# Patient Record
Sex: Male | Born: 1950 | Race: White | Hispanic: No | Smoking: Former smoker
Health system: Southern US, Community
[De-identification: ages and names within clinical notes are randomized; demographics above are authoritative.]

## PROBLEM LIST (undated history)

## (undated) DIAGNOSIS — Z8619 Personal history of other infectious and parasitic diseases: Secondary | ICD-10-CM

## (undated) DIAGNOSIS — C61 Malignant neoplasm of prostate: Secondary | ICD-10-CM

## (undated) DIAGNOSIS — R972 Elevated prostate specific antigen [PSA]: Secondary | ICD-10-CM

## (undated) DIAGNOSIS — N402 Nodular prostate without lower urinary tract symptoms: Secondary | ICD-10-CM

## (undated) DIAGNOSIS — I2510 Atherosclerotic heart disease of native coronary artery without angina pectoris: Secondary | ICD-10-CM

## (undated) DIAGNOSIS — T63441A Toxic effect of venom of bees, accidental (unintentional), initial encounter: Secondary | ICD-10-CM

## (undated) HISTORY — DX: Nodular prostate without lower urinary tract symptoms: N40.2

## (undated) HISTORY — DX: Elevated prostate specific antigen (PSA): R97.20

## (undated) HISTORY — PX: ELBOW SURGERY: SHX618

## (undated) HISTORY — PX: SHOULDER SURGERY: SHX246

## (undated) HISTORY — PX: TONSILLECTOMY AND ADENOIDECTOMY: SUR1326

## (undated) HISTORY — DX: Personal history of other infectious and parasitic diseases: Z86.19

## (undated) HISTORY — DX: Toxic effect of venom of bees, accidental (unintentional), initial encounter: T63.441A

---

## 2002-09-29 ENCOUNTER — Encounter: Payer: Self-pay | Admitting: Emergency Medicine

## 2002-09-29 ENCOUNTER — Emergency Department (HOSPITAL_COMMUNITY): Admission: EM | Admit: 2002-09-29 | Discharge: 2002-09-29 | Payer: Self-pay | Admitting: Emergency Medicine

## 2003-07-31 ENCOUNTER — Emergency Department (HOSPITAL_COMMUNITY): Admission: AD | Admit: 2003-07-31 | Discharge: 2003-07-31 | Payer: Self-pay | Admitting: Family Medicine

## 2003-08-02 ENCOUNTER — Encounter: Admission: RE | Admit: 2003-08-02 | Discharge: 2003-08-02 | Payer: Self-pay | Admitting: Internal Medicine

## 2003-08-03 ENCOUNTER — Encounter: Admission: RE | Admit: 2003-08-03 | Discharge: 2003-08-03 | Payer: Self-pay | Admitting: Internal Medicine

## 2010-10-22 ENCOUNTER — Emergency Department (HOSPITAL_COMMUNITY)
Admission: EM | Admit: 2010-10-22 | Discharge: 2010-10-22 | Payer: Self-pay | Source: Home / Self Care | Admitting: Emergency Medicine

## 2011-03-25 ENCOUNTER — Other Ambulatory Visit: Payer: Self-pay | Admitting: Internal Medicine

## 2011-03-25 NOTE — Telephone Encounter (Signed)
Patient has appt for CPX on 06/24/2011 at 1:00

## 2011-03-25 NOTE — Telephone Encounter (Signed)
Dr.Hopper is no longer accepting New Patient's. Please have patient schedule New Patient/Establish appointment with another Dr. In our facility.  Unable to locate patient in Centricity or IDX. Patient may have seen Dr.Hopper in the Hospital, no documentation of patient being an Established patient with Dr.Hopper

## 2011-04-02 NOTE — Telephone Encounter (Signed)
Has resch to see Dr Drue Novel on Wed 05/01/2011 as New patient Physical---Informed patient that, since Dr Drue Novel has not seen this patient, he would not be able to order a new prescription for the Epi-pen---pt understood

## 2011-04-05 ENCOUNTER — Other Ambulatory Visit: Payer: Self-pay | Admitting: Internal Medicine

## 2011-04-08 NOTE — Telephone Encounter (Signed)
Dr.Paz please advise if ok to fill EPI-Pen and then forward to [facility]. **Patient seen Dr.Hopper 2004 (8 years ago), pending New Patient appointment with you on 05/01/2011

## 2011-04-08 NOTE — Telephone Encounter (Signed)
[facility]opp's patient

## 2011-04-08 NOTE — Telephone Encounter (Signed)
Pt is establishing with you in August. Has not seen Hopp since 2004.

## 2011-04-10 ENCOUNTER — Telehealth: Payer: Self-pay | Admitting: *Deleted

## 2011-04-10 ENCOUNTER — Other Ambulatory Visit: Payer: Self-pay | Admitting: Internal Medicine

## 2011-04-10 NOTE — Telephone Encounter (Signed)
Error

## 2011-04-10 NOTE — Telephone Encounter (Signed)
Duplicate, patient has pending appointment with Dr.Paz and a phone note is already pending on Dr.Paz's Desktop to be addressed

## 2011-04-10 NOTE — Telephone Encounter (Signed)
I have no information about this patient , don't know if he was ever prescribed EpiPen by Dr. Alwyn Ren, I don't know if he has heart disease or high blood pressure. Get a sooner appointment if so desire

## 2011-04-10 NOTE — Telephone Encounter (Signed)
Message left for patient to return my call.  

## 2011-04-12 NOTE — Telephone Encounter (Signed)
Pt is aware.  

## 2011-05-01 ENCOUNTER — Encounter: Payer: Self-pay | Admitting: Internal Medicine

## 2011-05-01 ENCOUNTER — Ambulatory Visit (INDEPENDENT_AMBULATORY_CARE_PROVIDER_SITE_OTHER): Payer: BC Managed Care – PPO | Admitting: Internal Medicine

## 2011-05-01 DIAGNOSIS — T6391XA Toxic effect of contact with unspecified venomous animal, accidental (unintentional), initial encounter: Secondary | ICD-10-CM

## 2011-05-01 DIAGNOSIS — T63441A Toxic effect of venom of bees, accidental (unintentional), initial encounter: Secondary | ICD-10-CM

## 2011-05-01 DIAGNOSIS — H9190 Unspecified hearing loss, unspecified ear: Secondary | ICD-10-CM | POA: Insufficient documentation

## 2011-05-01 MED ORDER — EPINEPHRINE 0.3 MG/0.3ML IJ DEVI: 0.3000 mg | Freq: Once | INTRAMUSCULAR | Status: DC

## 2011-05-01 NOTE — Progress Notes (Signed)
  Subjective:    Patient ID: Noah Cochran, male    DOB: Aug 04, 1951, 60 y.o.   MRN: [id number]  HPI New patient, has not been seen in years, used to see Dr. Alfonse Flavors. Needs a refill on his EpiPen. 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 The reactions are not consistent with every stung, he has been stung 7 times lately without major reaction  but he likes to be ready . Additionally, has decreased right ear hearing for a few weeks.  Past Medical History  Diagnosis Date  . History of chicken pox   . Allergic reaction to bee sting    Past Surgical History  Procedure Date  . Tonsillectomy and adenoidectomy   . Elbow surgery     R elbow  . Shoulder surgery     reconstruction   History   Social History  . Marital Status: Divorced    Spouse Name: N/A    Number of Children: 1  . Years of Education: N/A   Occupational History  .     Social History Main Topics  . Smoking status: Current Everyday Smoker -- 1.0 packs/day  . Smokeless tobacco: Not on file  . Alcohol Use: Yes     beer after work, 2 /day  . Drug Use: No  . Sexually Active: Not on file   Other Topics Concern  . Not on file   Social History Narrative   Son lives with him   Family History  Problem Relation Age of Onset  . Heart disease      F had a MI age 44  . Diabetes      GF  . Colon cancer Neg Hx   . Prostate cancer Neg Hx      Review of Systems Denies any ear pain, ear discharge. No tinnitus No URI type of symptoms recently. Reports a history of several problems with the ears years ago mostly from diving.    Objective:   Physical Exam  Constitutional: He appears well-developed and well-nourished. No distress.  HENT:  Head: Normocephalic and atraumatic.       Face symmetric, not tender to palpation. Right tympanic membrane slightly retracted but otherwise the right ear is normal. Left ear normal.  Eyes: Conjunctivae and EOM are normal.  Cardiovascular: Normal  rate, regular rhythm and normal heart sounds.   No murmur heard. Pulmonary/Chest: Effort normal and breath sounds normal. No respiratory distress. He has no wheezes. He has no rales.  Skin: He is not diaphoretic.          Assessment & Plan:

## 2011-05-01 NOTE — Assessment & Plan Note (Signed)
Refer to ENT for further evaluation, no acute causes of decreased hearing that I can see

## 2011-05-01 NOTE — Patient Instructions (Signed)
Will refer you to a ENT specialist Came back for a complete physical (fasting) at your convenience

## 2011-05-01 NOTE — Assessment & Plan Note (Signed)
History of severe reactions on and off to bee stings. Refill EpiPen. Reminded the patient that the most important action is prevention. ER if he has a severe reaction

## 2011-05-07 ENCOUNTER — Encounter: Payer: Self-pay | Admitting: Internal Medicine

## 2011-05-16 ENCOUNTER — Other Ambulatory Visit: Payer: Self-pay | Admitting: *Deleted

## 2011-05-16 MED ORDER — EPINEPHRINE 0.3 MG/0.3ML IJ DEVI: 0.3000 mg | Freq: Once | INTRAMUSCULAR | Status: DC

## 2011-06-24 ENCOUNTER — Encounter: Payer: Self-pay | Admitting: Internal Medicine

## 2012-12-16 ENCOUNTER — Emergency Department (HOSPITAL_COMMUNITY)
Admission: EM | Admit: 2012-12-16 | Discharge: 2012-12-16 | Disposition: A | Discharge status: Home / Self Care | Payer: Worker's Compensation | Attending: Emergency Medicine | Admitting: Emergency Medicine

## 2012-12-16 ENCOUNTER — Encounter (HOSPITAL_COMMUNITY): Payer: Self-pay | Admitting: Emergency Medicine

## 2012-12-16 ENCOUNTER — Emergency Department (HOSPITAL_COMMUNITY): Payer: Worker's Compensation

## 2012-12-16 DIAGNOSIS — S61209A Unspecified open wound of unspecified finger without damage to nail, initial encounter: Secondary | ICD-10-CM | POA: Insufficient documentation

## 2012-12-16 DIAGNOSIS — S62639B Displaced fracture of distal phalanx of unspecified finger, initial encounter for open fracture: Secondary | ICD-10-CM | POA: Insufficient documentation

## 2012-12-16 DIAGNOSIS — S62609B Fracture of unspecified phalanx of unspecified finger, initial encounter for open fracture: Secondary | ICD-10-CM

## 2012-12-16 DIAGNOSIS — Y9389 Activity, other specified: Secondary | ICD-10-CM | POA: Insufficient documentation

## 2012-12-16 DIAGNOSIS — Z23 Encounter for immunization: Secondary | ICD-10-CM | POA: Insufficient documentation

## 2012-12-16 DIAGNOSIS — Y990 Civilian activity done for income or pay: Secondary | ICD-10-CM | POA: Insufficient documentation

## 2012-12-16 DIAGNOSIS — F17200 Nicotine dependence, unspecified, uncomplicated: Secondary | ICD-10-CM | POA: Insufficient documentation

## 2012-12-16 DIAGNOSIS — Y9289 Other specified places as the place of occurrence of the external cause: Secondary | ICD-10-CM | POA: Insufficient documentation

## 2012-12-16 DIAGNOSIS — IMO0002 Reserved for concepts with insufficient information to code with codable children: Secondary | ICD-10-CM

## 2012-12-16 DIAGNOSIS — W298XXA Contact with other powered powered hand tools and household machinery, initial encounter: Secondary | ICD-10-CM | POA: Insufficient documentation

## 2012-12-16 DIAGNOSIS — Z79899 Other long term (current) drug therapy: Secondary | ICD-10-CM | POA: Insufficient documentation

## 2012-12-16 DIAGNOSIS — Z8619 Personal history of other infectious and parasitic diseases: Secondary | ICD-10-CM | POA: Insufficient documentation

## 2012-12-16 LAB — POCT I-STAT, CHEM 8
BUN: 17 mg/dL (ref 6–23)
Calcium, Ion: 1.17 mmol/L (ref 1.13–1.30)
Chloride: 107 mEq/L (ref 96–112)
Creatinine, Ser: 0.8 mg/dL (ref 0.50–1.35)
Glucose, Bld: 93 mg/dL (ref 70–99)
HCT: 45 % (ref 39.0–52.0)
Hemoglobin: 15.3 g/dL (ref 13.0–17.0)
Potassium: 4.1 mEq/L (ref 3.5–5.1)
Sodium: 137 mEq/L (ref 135–145)
TCO2: 24 mmol/L (ref 0–100)

## 2012-12-16 MED ORDER — CEPHALEXIN 500 MG PO CAPS: 500.0000 mg | ORAL_CAPSULE | Freq: Four times a day (QID) | ORAL | Status: DC

## 2012-12-16 MED ORDER — ONDANSETRON 4 MG PO TBDP
4.0000 mg | ORAL_TABLET | Freq: Once | ORAL | Status: AC
Start: 2012-12-16 — End: 2012-12-16
  Administered 2012-12-16: 4 mg via ORAL
  Filled 2012-12-16: qty 1

## 2012-12-16 MED ORDER — TETANUS-DIPHTH-ACELL PERTUSSIS 5-2.5-18.5 LF-MCG/0.5 IM SUSP
0.5000 mL | Freq: Once | INTRAMUSCULAR | Status: AC
  Administered 2012-12-16: 0.5 mL via INTRAMUSCULAR
  Filled 2012-12-16: qty 0.5

## 2012-12-16 MED ORDER — OXYCODONE-ACETAMINOPHEN 5-325 MG PO TABS: 2.0000 | ORAL_TABLET | ORAL | Status: DC | PRN

## 2012-12-16 MED ORDER — HYDROCODONE-ACETAMINOPHEN 5-325 MG PO TABS
2.0000 | ORAL_TABLET | Freq: Once | ORAL | Status: AC
  Administered 2012-12-16: 2 via ORAL
  Filled 2012-12-16: qty 2

## 2012-12-16 NOTE — ED Notes (Signed)
Pt with deep wound to left 1st digit from table saw. Bleeding controlled.

## 2012-12-16 NOTE — Progress Notes (Signed)
Orthopedic Tech Progress Note Patient Details:  BUEFORD ARP 1951-09-03 [id number]  Ortho Devices Type of Ortho Device: Finger splint Ortho Device/Splint Interventions: Application   Cammer, Mickie Bail 12/16/2012, 3:39 PM

## 2012-12-16 NOTE — ED Provider Notes (Signed)
Medical screening examination/treatment/procedure(s) were conducted as a shared visit with non-physician practitioner(s) and myself.  I personally evaluated the patient during the encounter Pt suffered a saw cut to the distal phalanx of the left thumb while using a table saw at work.  Exam shows a fish-mouth laceration of the distal phalanx of the left thumb, cutting through just below the nail bed.  Advised this would need repair by the hand surgeon.  Carleene Cooper III, MD 12/16/12 2139

## 2012-12-16 NOTE — ED Provider Notes (Signed)
History     CSN: [id number]  Arrival date & time 12/16/12  1211   First MD Initiated Contact with Patient 12/16/12 1310      Chief Complaint  Patient presents with  . Laceration    (Consider location/radiation/quality/duration/timing/severity/associated sxs/prior treatment) HPI  Patient presents to the ED with complaints of injury to his left thumb. He was using a table saw for work when it accidentally backed up on his finger causing a significant laceration. The bleeding is controlled at this time. He is able to bend his thumb. He has decreased sensation to his finger. Denies any other injuries. Otherwise healthy at baseline. Last tetanus shot was greater than 10 years ago. nad vss  Past Medical History  Diagnosis Date  . History of chicken pox   . Allergic reaction to bee sting     Past Surgical History  Procedure Laterality Date  . Tonsillectomy and adenoidectomy    . Elbow surgery      R elbow  . Shoulder surgery      reconstruction    Family History  Problem Relation Age of Onset  . Heart disease      F had a MI age 36  . Diabetes      GF  . Colon cancer Neg Hx   . Prostate cancer Neg Hx     History  Substance Use Topics  . Smoking status: Current Every Day Smoker -- 1.00 packs/day  . Smokeless tobacco: Not on file  . Alcohol Use: Yes     Comment: beer after work, 2 /day      Review of Systems  All other systems reviewed and are negative.    Allergies  Nutritional supplements  Home Medications   Current Outpatient Rx  Name  Route  Sig  Dispense  Refill  . EPINEPHrine (EPIPEN) 0.3 mg/0.3 mL DEVI   Intramuscular   Inject 0.3 mg into the muscle once as needed (for allergic reaction).         Marland Kitchen ibuprofen (ADVIL,MOTRIN) 200 MG tablet   Oral   Take 200 mg by mouth every 6 (six) hours as needed for pain, fever or headache.           BP 172/74  Pulse 62  Temp(Src) 97.4 F (36.3 C) (Oral)  Resp 18  SpO2 96%  Physical Exam  Nursing  note and vitals reviewed. Constitutional: He appears well-developed and well-nourished. No distress.  HENT:  Head: Normocephalic and atraumatic.  Eyes: Pupils are equal, round, and reactive to light.  Neck: Normal range of motion. Neck supple.  Cardiovascular: Normal rate and regular rhythm.   Pulmonary/Chest: Effort normal.  Abdominal: Soft.  Musculoskeletal:       Hands: A sagittal laceration to right thumb that extends past the nail bed. The border is very irregular and much skin and tissue is missing. NO obvious bone showing. Decreased sensation, FROM. + tenderness to palpation  Neurological: He is alert.  Skin: Skin is warm and dry.    ED Course  NERVE BLOCK Date/Time: 12/16/2012 3:11 PM Performed by: Dorthula Matas Authorized by: Dorthula Matas Risks and benefits: risks, benefits and alternatives were discussed Consent given by: patient Local anesthetic: lidocaine 1% without epinephrine Anesthetic total: 4 ml Outcome: pain improved   (including critical care time)  Labs Reviewed  POCT I-STAT, CHEM 8   Dg Finger Thumb Left  12/16/2012  *RADIOLOGY REPORT*  Clinical Data: Laceration  LEFT THUMB 2+V  Comparison: None.  Findings:  Three views of the left thumb submitted.  There is comminuted probable open fracture at the tip of distal phalanx.  IMPRESSION: Comminuted probable open fracture at the tip of distal phalanx left thumb.   Original Report Authenticated By: Natasha Mead, M.D.      1. Open finger fracture, initial encounter       MDM  2:03pm- Finger wound is extensive. Consulted Dr. Ignacia Palma my supervising physician for guidance. He recommends I call hand surgeon to have finger repaired. Consulting on-call Hydrographic surveyor.  Tetanus ordered, 2 vicodin PO given and I-stat 8 ordered.    Spoke with Dr. Orlan Leavens who recommends digital block, irrigate, non adherent dressing, splint, keflex abx and have pt call Dr. Tonna Corner office on Thursday or Friday.  .  Pt has  been advised of the symptoms that warrant their return to the ED. Patient has voiced understanding and has agreed to follow-up with the PCP or specialist.     Dorthula Matas, PA-C 12/16/12 1511

## 2015-01-19 ENCOUNTER — Other Ambulatory Visit: Payer: Self-pay

## 2015-03-02 ENCOUNTER — Telehealth: Payer: Self-pay | Admitting: *Deleted

## 2015-03-02 NOTE — Telephone Encounter (Signed)
Unable to reach patient at time of Pre-Visit Call.  Left message for patient to return call when available.    

## 2015-03-03 ENCOUNTER — Ambulatory Visit (INDEPENDENT_AMBULATORY_CARE_PROVIDER_SITE_OTHER): Payer: 59 | Admitting: Internal Medicine

## 2015-03-03 ENCOUNTER — Encounter: Payer: Self-pay | Admitting: Internal Medicine

## 2015-03-03 VITALS — BP 106/66 | HR 85 | Temp 98.1°F | Ht 64.5 in | Wt 112.1 lb

## 2015-03-03 DIAGNOSIS — T63444D Toxic effect of venom of bees, undetermined, subsequent encounter: Secondary | ICD-10-CM

## 2015-03-03 DIAGNOSIS — Z0000 Encounter for general adult medical examination without abnormal findings: Secondary | ICD-10-CM | POA: Diagnosis not present

## 2015-03-03 MED ORDER — EPINEPHRINE 0.3 MG/0.3ML IJ SOAJ: 0.3000 mg | Freq: Once | INTRAMUSCULAR | Status: DC

## 2015-03-03 NOTE — Progress Notes (Signed)
Pre visit review using our clinic review tool, if applicable. No additional management support is needed unless otherwise documented below in the visit note. 

## 2015-03-03 NOTE — Progress Notes (Signed)
Subjective:    Patient ID: Noah Cochran, male    DOB: 1950/12/15, 64 y.o.   MRN: [id number]  DOS:  03/03/2015 Type of visit - description : To reestablish, needs a PCP, we agreed to   proceed with a physical exam Interval history: In general feeling well. He remains active at work. Has lost some weight mostly because he is not eating as much, he now lives by himself and he does not like to cook. Continue smoking   Review of Systems Constitutional: No fever. No chills. No unexplained wt changes. No unusual sweats  HEENT: No dental problems, no ear discharge, no facial swelling, no voice changes. No eye discharge, no eye  redness , no  intolerance to light   Respiratory: No wheezing , no  difficulty breathing. No cough , no mucus production  Cardiovascular: No CP, no leg swelling , no  Palpitations  GI: no nausea, no vomiting, no diarrhea , no  abdominal pain.  No blood in the stools. No dysphagia, no odynophagia    Endocrine: No polyphagia, no polyuria , no polydipsia  GU: No dysuria, gross hematuria, difficulty urinating. No urinary urgency, no frequency.  Musculoskeletal: No joint swellings or unusual aches or pains  Skin: No change in the color of the skin, palor , no  Rash  Allergic, immunologic: No environmental allergies , no  food allergies  Neurological: No dizziness no  syncope. No headaches. No diplopia, no slurred, no slurred speech, no motor deficits, no facial  Numbness  Hematological: No enlarged lymph nodes, no easy bruising , no unusual bleedings  Psychiatry: No suicidal ideas, no hallucinations, no beavior problems, no confusion.  No unusual/severe anxiety, no depression    Past Medical History  Diagnosis Date  . History of chicken pox   . Allergic reaction to bee sting     Past Surgical History  Procedure Laterality Date  . Tonsillectomy and adenoidectomy    . Elbow surgery      R elbow  . Shoulder surgery      reconstruction    History     Social History  . Marital Status: Divorced    Spouse Name: N/A  . Number of Children: 1  . Years of Education: N/A   Occupational History  . carpenter    Social History Main Topics  . Smoking status: Current Every Day Smoker -- 1.00 packs/day  . Smokeless tobacco: Not on file     Comment: 1 ppd   . Alcohol Use: 0.0 oz/week    0 Standard drinks or equivalent per week     Comment: beer after work, 2 /day  . Drug Use: No  . Sexual Activity: Not on file   Other Topics Concern  . Not on file   Social History Narrative   Lives by himself     Family History  Problem Relation Age of Onset  . Heart disease Father     F had a MI age 71  . Diabetes Other     GF  . Colon cancer Neg Hx   . Prostate cancer Neg Hx       Medication List       This list is accurate as of: 03/03/15 11:59 PM.  Always use your most recent med list.               EPINEPHrine 0.3 mg/0.3 mL Soaj injection  Commonly known as:  EPI-PEN  Inject 0.3 mLs (0.3 mg total) into the  muscle once.     ibuprofen 200 MG tablet  Commonly known as:  ADVIL,MOTRIN  Take 200 mg by mouth every 6 (six) hours as needed for pain, fever or headache.     MULTIVITAMIN PO  Take 1 tablet by mouth daily.           Objective:   Physical Exam BP 106/66 mmHg  Pulse 85  Temp(Src) 98.1 F (36.7 C) (Oral)  Ht 5' 4.5" (1.638 m)  Wt 112 lb 2 oz (50.86 kg)  BMI 18.96 kg/m2  SpO2 95% General:   Well developed, well nourished . Thin  but healthy-appearing  Neck:  Full range of motion. Supple. No  thyromegaly , normal carotid pulse HEENT:  Normocephalic . Face symmetric, atraumatic. No lymphadenopathies Lungs:  CTA B Normal respiratory effort, no intercostal retractions, no accessory muscle use. Heart: RRR,  no murmur.  No pretibial edema bilaterally  Abdomen:  Not distended, soft, non-tender. No rebound or rigidity. No mass,organomegaly Skin: Exposed areas without rash. Not pale. Not jaundice. Rectal:   External abnormalities: none. Normal sphincter tone. No rectal masses or tenderness.  No stool   Prostate: Prostate gland firm and smooth, symmetrically enlarged without nodularity, tenderness, mass  Neurologic:  alert & oriented X3.  Speech normal, gait appropriate for age and unassisted Strength symmetric and appropriate for age.  Psych: Cognition and judgment appear intact.  Cooperative with normal attention span and concentration.  Behavior appropriate. No anxious or depressed appearing.       Assessment & Plan:

## 2015-03-03 NOTE — Assessment & Plan Note (Signed)
RF epipen

## 2015-03-03 NOTE — Patient Instructions (Signed)
Please schedule labs to be done within few days (fasting)   Smoking Cessation Quitting smoking is important to your health and has many advantages. However, it is not always easy to quit since nicotine is a very addictive drug. Oftentimes, people try 3 times or more before being able to quit. This document explains the best ways for you to prepare to quit smoking. Quitting takes hard work and a lot of effort, but you can do it. ADVANTAGES OF QUITTING SMOKING  You will live longer, feel better, and live better.  Your body will feel the impact of quitting smoking almost immediately.  Within 20 minutes, blood pressure decreases. Your pulse returns to its normal level.  After 8 hours, carbon monoxide levels in the blood return to normal. Your oxygen level increases.  After 24 hours, the chance of having a heart attack starts to decrease. Your breath, hair, and body stop smelling like smoke.  After 48 hours, damaged nerve endings begin to recover. Your sense of taste and smell improve.  After 72 hours, the body is virtually free of nicotine. Your bronchial tubes relax and breathing becomes easier.  After 2 to 12 weeks, lungs can hold more air. Exercise becomes easier and circulation improves.  The risk of having a heart attack, stroke, cancer, or lung disease is greatly reduced.  After 1 year, the risk of coronary heart disease is cut in half.  After 5 years, the risk of stroke falls to the same as a nonsmoker.  After 10 years, the risk of lung cancer is cut in half and the risk of other cancers decreases significantly.  After 15 years, the risk of coronary heart disease drops, usually to the level of a nonsmoker.  If you are pregnant, quitting smoking will improve your chances of having a healthy baby.  The people you live with, especially any children, will be healthier.  You will have extra money to spend on things other than cigarettes. QUESTIONS TO THINK ABOUT BEFORE ATTEMPTING  TO QUIT You may want to talk about your answers with your health care provider.  Why do you want to quit?  If you tried to quit in the past, what helped and what did not?  What will be the most difficult situations for you after you quit? How will you plan to handle them?  Who can help you through the tough times? Your family? Friends? A health care provider?  What pleasures do you get from smoking? What ways can you still get pleasure if you quit? Here are some questions to ask your health care provider:  How can you help me to be successful at quitting?  What medicine do you think would be best for me and how should I take it?  What should I do if I need more help?  What is smoking withdrawal like? How can I get information on withdrawal? GET READY  Set a quit date.  Change your environment by getting rid of all cigarettes, ashtrays, matches, and lighters in your home, car, or work. Do not let people smoke in your home.  Review your past attempts to quit. Think about what worked and what did not. GET SUPPORT AND ENCOURAGEMENT You have a better chance of being successful if you have help. You can get support in many ways.  Tell your family, friends, and coworkers that you are going to quit and need their support. Ask them not to smoke around you.  Get individual, group, or telephone counseling  and support. Programs are available at [employer] and health centers. Call your local health department for information about programs in your area.  Spiritual beliefs and practices may help some smokers quit.  Download a "quit meter" on your computer to keep track of quit statistics, such as how long you have gone without smoking, cigarettes not smoked, and money saved.  Get a self-help book about quitting smoking and staying off tobacco. [place] yourself from urges to smoke. Talk to someone, go for a walk, or occupy your time with a  task.  Change your normal routine. Take a different route to work. Drink tea instead of coffee. Eat breakfast in a different place.  Reduce your stress. Take a hot bath, exercise, or read a book.  Plan something enjoyable to do every day. Reward yourself for not smoking.  Explore interactive web-based programs that specialize in helping you quit. GET MEDICINE AND USE IT CORRECTLY Medicines can help you stop smoking and decrease the urge to smoke. Combining medicine with the above behavioral methods and support can greatly increase your chances of successfully quitting smoking.  Nicotine replacement therapy helps deliver nicotine to your body without the negative effects and risks of smoking. Nicotine replacement therapy includes nicotine gum, lozenges, inhalers, nasal sprays, and skin patches. Some may be available over-the-counter and others require a prescription.  Antidepressant medicine helps people abstain from smoking, but how this works is unknown. This medicine is available by prescription.  Nicotinic receptor partial agonist medicine simulates the effect of nicotine in your brain. This medicine is available by prescription. Ask your health care provider for advice about which medicines to use and how to use them based on your health history. Your health care provider will tell you what side effects to look out for if you choose to be on a medicine or therapy. Carefully read the information on the package. Do not use any other product containing nicotine while using a nicotine replacement product.  RELAPSE OR DIFFICULT SITUATIONS Most relapses occur within the first 3 months after quitting. Do not be discouraged if you start smoking again. Remember, most people try several times before finally quitting. You may have symptoms of withdrawal because your body is used to nicotine. You may crave cigarettes, be irritable, feel very hungry, cough often, get headaches, or have difficulty  concentrating. The withdrawal symptoms are only temporary. They are strongest when you first quit, but they will go away within 10-14 days. To reduce the chances of relapse, try to:  Avoid drinking alcohol. Drinking lowers your chances of successfully quitting.  Reduce the amount of caffeine you consume. Once you quit smoking, the amount of caffeine in your body increases and can give you symptoms, such as a rapid heartbeat, sweating, and anxiety.  Avoid smokers because they can make you want to smoke.  Do not let weight gain distract you. Many smokers will gain weight when they quit, usually less than 10 pounds. Eat a healthy diet and stay active. You can always lose the weight gained after you quit.  Find ways to improve your mood other than smoking. FOR MORE INFORMATION  www.smokefree.gov  Document Released: 09/03/2001 Document Revised: 01/24/2014 Document Reviewed: 12/19/2011 [facility] Patient Information 2015 [place], [place]. This information is not intended to replace advice given to you by your health care provider. Make sure you discuss any questions you have with your health care provider.

## 2015-03-03 NOTE — Assessment & Plan Note (Signed)
Td 2014  Colon cancer screening: Never had a colonoscopy, Hemoccults versus colonoscopy discussed-- elected cscope , referred  Prostate cancer screening: DRE show a symmetrically enlarged prostate, no symptoms, check a PSA Tobacco abuse: Discussed. He does see the dentist regularly. General labs Diet and exercise discussed

## 2015-03-07 ENCOUNTER — Other Ambulatory Visit (INDEPENDENT_AMBULATORY_CARE_PROVIDER_SITE_OTHER): Payer: 59

## 2015-03-07 DIAGNOSIS — Z0000 Encounter for general adult medical examination without abnormal findings: Secondary | ICD-10-CM

## 2015-03-07 LAB — COMPREHENSIVE METABOLIC PANEL
ALT: 22 U/L (ref 0–53)
AST: 30 U/L (ref 0–37)
Albumin: 4.2 g/dL (ref 3.5–5.2)
Alkaline Phosphatase: 91 U/L (ref 39–117)
BUN: 15 mg/dL (ref 6–23)
CO2: 28 mEq/L (ref 19–32)
Calcium: 9.5 mg/dL (ref 8.4–10.5)
Chloride: 104 mEq/L (ref 96–112)
Creatinine, Ser: 0.86 mg/dL (ref 0.40–1.50)
GFR: 95.58 mL/min (ref >60.00)
Glucose, Bld: 86 mg/dL (ref 70–99)
Potassium: 4.7 mEq/L (ref 3.5–5.1)
Sodium: 139 mEq/L (ref 135–145)
Total Bilirubin: 0.6 mg/dL (ref 0.2–1.2)
Total Protein: 7.4 g/dL (ref 6.0–8.3)

## 2015-03-07 LAB — LIPID PANEL
Cholesterol: 237 mg/dL — ABNORMAL HIGH (ref 0–200)
HDL: 77.7 mg/dL (ref >39.00)
LDL Cholesterol: 142 mg/dL — ABNORMAL HIGH (ref 0–99)
NonHDL: 159.3
Total CHOL/HDL Ratio: 3
Triglycerides: 89 mg/dL (ref 0.0–149.0)
VLDL: 17.8 mg/dL (ref 0.0–40.0)

## 2015-03-07 LAB — CBC WITH DIFFERENTIAL/PLATELET
Basophils Absolute: 0.1 10*3/uL (ref 0.0–0.1)
Basophils Relative: 0.9 % (ref 0.0–3.0)
Eosinophils Absolute: 0.4 10*3/uL (ref 0.0–0.7)
Eosinophils Relative: 7 % — ABNORMAL HIGH (ref 0.0–5.0)
HCT: 46.6 % (ref 39.0–52.0)
Hemoglobin: 15.8 g/dL (ref 13.0–17.0)
Lymphocytes Relative: 23.5 % (ref 12.0–46.0)
Lymphs Abs: 1.4 10*3/uL (ref 0.7–4.0)
MCHC: 34 g/dL (ref 30.0–36.0)
MCV: 90.7 fl (ref 78.0–100.0)
Monocytes Absolute: 1.1 10*3/uL — ABNORMAL HIGH (ref 0.1–1.0)
Monocytes Relative: 18.3 % — ABNORMAL HIGH (ref 3.0–12.0)
Neutro Abs: 2.9 10*3/uL (ref 1.4–7.7)
Neutrophils Relative %: 50.3 % (ref 43.0–77.0)
Platelets: 240 10*3/uL (ref 150.0–400.0)
RBC: 5.14 Mil/uL (ref 4.22–5.81)
RDW: 14 % (ref 11.5–15.5)
WBC: 5.8 10*3/uL (ref 4.0–10.5)

## 2015-03-07 LAB — TSH: TSH: 1.07 u[IU]/mL (ref 0.35–4.50)

## 2015-03-07 LAB — PSA: PSA: 8.11 ng/mL — ABNORMAL HIGH (ref 0.10–4.00)

## 2015-03-13 ENCOUNTER — Telehealth: Payer: Self-pay | Admitting: Internal Medicine

## 2015-03-13 DIAGNOSIS — R972 Elevated prostate specific antigen [PSA]: Secondary | ICD-10-CM

## 2015-03-13 NOTE — Telephone Encounter (Signed)
Spoke with Pt, informed him of lab results. Informed Pt that Dr. [name] would like to recheck his PSA in 6 months (lab appt only). Offered to schedule Pt a lab appt, he declined and stated he would call back to schedule closer to time. Informed him that would be fine.

## 2015-03-13 NOTE — Telephone Encounter (Signed)
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

## 2015-03-13 NOTE — Telephone Encounter (Signed)
Noted, will await Pt call back.

## 2015-03-13 NOTE — Telephone Encounter (Signed)
PSA future ordered.

## 2015-09-28 ENCOUNTER — Telehealth: Payer: Self-pay | Admitting: Internal Medicine

## 2015-09-28 NOTE — Telephone Encounter (Signed)
Advise patient, he is due for a repeated PSA, please arrange

## 2015-09-28 NOTE — Telephone Encounter (Signed)
Letter printed and mailed to Pt. PSA ordered.

## 2015-11-26 ENCOUNTER — Telehealth: Payer: Self-pay | Admitting: Internal Medicine

## 2015-11-26 NOTE — Telephone Encounter (Signed)
Call patient, set up an appointment for labs only.  Needs a PSA DX increased PSA

## 2015-11-27 NOTE — Telephone Encounter (Signed)
LM to call and schedule appt for repeat PSA (was due in Jan)

## 2015-11-27 NOTE — Telephone Encounter (Signed)
LM 2nd msg to call and schedule lab appt for PSA repeat

## 2015-11-28 NOTE — Telephone Encounter (Signed)
LM 3rd msg to call and schedule lab appt for repeat PSA.

## 2015-11-28 NOTE — Telephone Encounter (Signed)
Send a certified letter: Noah Cochran, your PSA or prostate blood test was 8.11 few months ago, is very important to repeat the test every 6 months to be sure  the levels are normalizing; if they remain elevated I'm planing to send you to a UROLOGIST for further evaluation; in your case a elevated PSA may indicate prostate cancer , a treatable condition if diagnosed early. Please contact our office ASAP to get the test schedule.

## 2015-11-28 NOTE — Telephone Encounter (Signed)
Noted. Forwarding to Dr. [name].

## 2015-11-29 NOTE — Telephone Encounter (Signed)
Letter printed and mailed to Pt.  

## 2015-12-04 ENCOUNTER — Other Ambulatory Visit (INDEPENDENT_AMBULATORY_CARE_PROVIDER_SITE_OTHER): Payer: BLUE CROSS/BLUE SHIELD

## 2015-12-04 DIAGNOSIS — R972 Elevated prostate specific antigen [PSA]: Secondary | ICD-10-CM | POA: Diagnosis not present

## 2015-12-04 LAB — PSA: PSA: 5.87 ng/mL — ABNORMAL HIGH (ref 0.10–4.00)

## 2015-12-06 NOTE — Addendum Note (Signed)
Addended byDamita Dunnings D on: 12/06/2015 01:40 PM   Modules accepted: Orders

## 2016-01-31 HISTORY — PX: PROSTATE BIOPSY: SHX241

## 2016-02-28 ENCOUNTER — Encounter: Payer: Self-pay | Admitting: Radiation Oncology

## 2016-02-28 NOTE — Progress Notes (Signed)
GU Location of Tumor / Histology: prostatic adenocarcinoma   If Prostate Cancer, Gleason Score is (3 + 4) and PSA is (6.86) on 12/27/15  MACCOY CANIDA was referred in April to Dr. Jeffie Pollock by Dr. [name] to evaluate an elevated PSA of 5.87.  Biopsies of prostate (if applicable) revealed:    Past/Anticipated interventions by urology, if any: biopsy and referral to Dr. Tammi Klippel. Patient is most interested in brachytherapy but, Jeffie Pollock is concerned about small prostate volume of 12.16 cc. Jeffie Pollock is encouraging prostatectomy.   Past/Anticipated interventions by medical oncology, if any: no  Weight changes, if any: no  Bowel/Bladder complaints, if any: unsure of specific symptoms but, Wrenn's note revealed an ipss of 4 but, IPSS for consult today is 2. Patient reports urgency and weak stream less than 1 in 5 times. Denies leakage or incontinence. Denies dysuria orhematuria.   Nausea/Vomiting, if any: no  Pain issues, if any:  no  SAFETY ISSUES:  Prior radiation? no  Pacemaker/ICD? no  Possible current pregnancy? no  Is the patient on methotrexate? no  Current Complaints / other details:  65 year old. Divorced. [employer]. Everyday smoker.

## 2016-02-29 ENCOUNTER — Ambulatory Visit
Admission: RE | Admit: 2016-02-29 | Discharge: 2016-02-29 | Disposition: A | Discharge status: Home / Self Care | Payer: BLUE CROSS/BLUE SHIELD | Source: Ambulatory Visit | Attending: Radiation Oncology | Admitting: Radiation Oncology

## 2016-02-29 ENCOUNTER — Encounter: Payer: Self-pay | Admitting: Radiation Oncology

## 2016-02-29 VITALS — BP 158/81 | HR 69 | Resp 16 | Ht 66.0 in | Wt 117.6 lb

## 2016-02-29 DIAGNOSIS — C61 Malignant neoplasm of prostate: Secondary | ICD-10-CM | POA: Diagnosis present

## 2016-02-29 HISTORY — DX: Malignant neoplasm of prostate: C61

## 2016-02-29 NOTE — Progress Notes (Signed)
See progress note under physician encounter. 

## 2016-02-29 NOTE — Progress Notes (Signed)
Radiation Oncology         [phone] ________________________________  Initial Outpatient Consultation  Name: Noah Cochran MRN: [id number]  Date: 02/29/2016  DOB: Sep 28, 1950  [id number] Noah Kells, MD  Irine Seal, MD   REFERRING PHYSICIAN: Irine Seal, MD  DIAGNOSIS: 65 y.o. 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 Malignant neoplasm of prostate ([place]) 185 C61   2. Prostate cancer ([place]) Noah Cochran is a 65 y.o. gentleman.  He was noted to have an elevated PSA of 5.87, on 12/04/15, by his primary care physician, Dr. Larose Cochran.  It was 8.11 on 03/07/15. Accordingly, he was referred for evaluation in urology by Dr. Jeffie Pollock on 12/25/15,  digital rectal examination was performed at that time revealing no nodularity, but there was slight induration on the right side.  The patient proceeded to transrectal ultrasound with 12 biopsies of the prostate on 01/31/16.  The prostate volume measured 12.16 cc.  Out of 12 core biopsies, 4 were positive.  The maximum Gleason score was 3+4, and this was seen in the right apex lateral. The Gleason score was 3+3 of the left base lateral, right base, and right apex.  The patient reviewed the biopsy results with his urologist and he has kindly been referred today for discussion of potential radiation treatment options.  PREVIOUS RADIATION THERAPY: No  PAST MEDICAL HISTORY:  has a past medical history of History of chicken pox; Allergic reaction to bee sting; Elevated prostate specific antigen (PSA); Nodular prostate without urinary obstruction; and Prostate cancer ([place]).    PAST SURGICAL HISTORY: Past Surgical History  Procedure Laterality Date  . Tonsillectomy and adenoidectomy    . Elbow surgery      R elbow  . Shoulder surgery      reconstruction  . Prostate biopsy  01/31/2016    FAMILY HISTORY: family history includes Diabetes in  his other; Heart disease in his father. There is no history of Colon cancer, Prostate cancer, or Cancer.  SOCIAL HISTORY:  reports that he has been smoking Cigarettes.  He has a 40 pack-year smoking history. He has never used smokeless tobacco. He reports that he drinks alcohol. He reports that he does not use illicit drugs.  ALLERGIES: Bee venom  MEDICATIONS:  Current Outpatient Prescriptions  Medication Sig Dispense Refill  . EPINEPHrine 0.3 mg/0.3 mL IJ SOAJ injection Inject 0.3 mLs (0.3 mg total) into the muscle once. 1 Device 3  . ibuprofen (ADVIL,MOTRIN) 200 MG tablet Take 200 mg by mouth every 6 (six) hours as needed for pain, fever or headache.    . Multiple Vitamins-Minerals (MULTIVITAMIN PO) Take 1 tablet by mouth daily.     No current facility-administered medications for this encounter.    REVIEW OF SYSTEMS:  A 15 point review of systems is documented in the electronic medical record. This was obtained by the nursing staff. However, I reviewed this with the patient to discuss relevant findings and make appropriate changes.  Pertinent items are noted in HPI..  The patient completed an IPSS and IIEF questionnaire.  His IPSS score was 2 indicating mild urinary outflow obstructive symptoms.  He indicated that his erectile function is able to complete sexual activity on almost all attempts..   PHYSICAL EXAM: This patient is in no acute distress.  He is alert and oriented.   height  is 5\' 6"  (1.676 m) and weight is 117 lb 9.6 oz (53.343 kg). His blood pressure is 158/81 and his pulse is 69. His respiration is 16 and oxygen saturation is 100%.  He exhibits no respiratory distress or labored breathing.  He appears neurologically intact.  His mood is pleasant.  His affect is appropriate.  Please note the digital rectal exam findings described above.  KPS = 100  100 - Normal; no complaints; no evidence of disease. 90   - Able to carry on normal activity; minor signs or symptoms of  disease. 80   - Normal activity with effort; some signs or symptoms of disease. 27   - Cares for self; unable to carry on normal activity or to do active work. 60   - Requires occasional assistance, but is able to care for most of his personal needs. 50   - Requires considerable assistance and frequent medical care. 15   - Disabled; requires special care and assistance. 59   - Severely disabled; hospital admission is indicated although death not imminent. 44   - Very sick; hospital admission necessary; active supportive treatment necessary. 10   - Moribund; fatal processes progressing rapidly. 0     - Dead  Karnofsky DA, Abelmann Shellman, Craver LS and Burchenal [facility] [phone]) The use of the nitrogen mustards in the palliative treatment of carcinoma: with particular reference to bronchogenic carcinoma Cancer 1 [id number]   LABORATORY DATA:  Lab Results  Component Value Date   WBC 5.8 03/07/2015   HGB 15.8 03/07/2015   HCT 46.6 03/07/2015   MCV 90.7 03/07/2015   PLT 240.0 03/07/2015   Lab Results  Component Value Date   NA 139 03/07/2015   K 4.7 03/07/2015   CL 104 03/07/2015   CO2 28 03/07/2015   Lab Results  Component Value Date   ALT 22 03/07/2015   AST 30 03/07/2015   ALKPHOS 91 03/07/2015   BILITOT 0.6 03/07/2015     RADIOGRAPHY: No results found.    IMPRESSION: This gentleman is a 65 y.o. 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  His T-Stage, Gleason's Score, and PSA put him into the intermediate risk group.  Accordingly he is eligible for a variety of potential treatment options including prostatectomy, external beam radiation, or seed implant.  His prostate volume is small less than 15 cc potentially affecting implant quality with seeds  PLAN: Today I reviewed the findings and workup thus far.  We discussed the natural history of prostate cancer.  We reviewed the the implications of T-stage, Gleason's Score, and PSA  on decision-making and outcomes in prostate cancer.  We discussed radiation treatment in the management of prostate cancer with regard to the logistics and delivery of external beam radiation treatment as well as the logistics and delivery of prostate brachytherapy.  We compared and contrasted each of these approaches and also compared these against prostatectomy.  The patient expressed interest in prostate brachytherapy.  However, the patient's prostate size is 12.16 cc, which is small and increases the chance of complications associated with prostate brachytherapy. Upon discussing this with the patient, he would like to make his decision regarding treatment after his scheduled PSA re-check in August. I agreed with this proposal. We will call Mr. Feik in August to determine a future course of action.  I enjoyed meeting with him today, and will look forward to participating in the care of  this very nice gentleman.  I spent 40 minutes face to face with the patient and more than 50% of that time was spent in counseling and/or coordination of care.   ------------------------------------------------  Sheral Apley. Tammi Klippel, M.D.  This document serves as a record of services personally performed by Tyler Pita, MD. It was created on his behalf by Darcus Austin, a trained medical scribe. The creation of this record is based on the scribe's personal observations and the provider's statements to them. This document has been checked and approved by the attending provider.

## 2016-03-05 ENCOUNTER — Ambulatory Visit (INDEPENDENT_AMBULATORY_CARE_PROVIDER_SITE_OTHER): Payer: BLUE CROSS/BLUE SHIELD | Admitting: Internal Medicine

## 2016-03-05 ENCOUNTER — Encounter: Payer: Self-pay | Admitting: Internal Medicine

## 2016-03-05 VITALS — BP 132/78 | HR 75 | Temp 98.1°F | Ht 66.0 in | Wt 115.2 lb

## 2016-03-05 DIAGNOSIS — Z0000 Encounter for general adult medical examination without abnormal findings: Secondary | ICD-10-CM | POA: Diagnosis not present

## 2016-03-05 DIAGNOSIS — Z09 Encounter for follow-up examination after completed treatment for conditions other than malignant neoplasm: Secondary | ICD-10-CM | POA: Insufficient documentation

## 2016-03-05 LAB — CBC WITH DIFFERENTIAL/PLATELET
BASOS ABS: 0.1 10*3/uL (ref 0.0–0.1)
BASOS PCT: 0.9 % (ref 0.0–3.0)
EOS ABS: 0.3 10*3/uL (ref 0.0–0.7)
Eosinophils Relative: 5 % (ref 0.0–5.0)
HEMATOCRIT: 46 % (ref 39.0–52.0)
Hemoglobin: 15.6 g/dL (ref 13.0–17.0)
LYMPHS ABS: 1.7 10*3/uL (ref 0.7–4.0)
Lymphocytes Relative: 27.4 % (ref 12.0–46.0)
MCHC: 34 g/dL (ref 30.0–36.0)
MCV: 93.1 fl (ref 78.0–100.0)
Monocytes Absolute: 0.9 10*3/uL (ref 0.1–1.0)
Monocytes Relative: 14.2 % — ABNORMAL HIGH (ref 3.0–12.0)
NEUTROS ABS: 3.2 10*3/uL (ref 1.4–7.7)
NEUTROS PCT: 52.5 % (ref 43.0–77.0)
PLATELETS: 209 10*3/uL (ref 150.0–400.0)
RBC: 4.94 Mil/uL (ref 4.22–5.81)
RDW: 13.3 % (ref 11.5–15.5)
WBC: 6.1 10*3/uL (ref 4.0–10.5)

## 2016-03-05 LAB — COMPREHENSIVE METABOLIC PANEL
ALT: 18 U/L (ref 0–53)
AST: 28 U/L (ref 0–37)
Albumin: 4.3 g/dL (ref 3.5–5.2)
Alkaline Phosphatase: 66 U/L (ref 39–117)
BILIRUBIN TOTAL: 0.5 mg/dL (ref 0.2–1.2)
BUN: 19 mg/dL (ref 6–23)
CHLORIDE: 104 meq/L (ref 96–112)
CO2: 28 meq/L (ref 19–32)
CREATININE: 0.91 mg/dL (ref 0.40–1.50)
Calcium: 9.5 mg/dL (ref 8.4–10.5)
GFR: 89.26 mL/min (ref >60.00)
GLUCOSE: 65 mg/dL — AB (ref 70–99)
Potassium: 4.4 mEq/L (ref 3.5–5.1)
SODIUM: 139 meq/L (ref 135–145)
Total Protein: 7.6 g/dL (ref 6.0–8.3)

## 2016-03-05 LAB — LIPID PANEL
CHOLESTEROL: 217 mg/dL — AB (ref 0–200)
HDL: 79.7 mg/dL (ref >39.00)
LDL Cholesterol: 123 mg/dL — ABNORMAL HIGH (ref 0–99)
NonHDL: 137.4
Total CHOL/HDL Ratio: 3
Triglycerides: 71 mg/dL (ref 0.0–149.0)
VLDL: 14.2 mg/dL (ref 0.0–40.0)

## 2016-03-05 MED ORDER — EPINEPHRINE 0.3 MG/0.3ML IJ SOAJ: 0.3000 mg | Freq: Once | INTRAMUSCULAR | Status: DC

## 2016-03-05 NOTE — Patient Instructions (Signed)
GO TO THE LAB : Get the blood work     GO TO THE FRONT DESK Schedule your next appointment for a  sickle exam in one year, fasting

## 2016-03-05 NOTE — Progress Notes (Signed)
Subjective:    Patient ID: Noah Cochran, male    DOB: Aug 27, 1951, 65 y.o.   MRN: [id number]  DOS:  03/05/2016 Type of visit - description :  CPX Interval history:  Since the last office visit, was diagnosed with prostate cancer. Has not decide what treatment to start, thinking about it. Doing well emotionally.   Review of Systems Constitutional: No fever. No chills. No unexplained wt changes. No unusual sweats  HEENT: No dental problems, no ear discharge, no facial swelling, no voice changes. No eye discharge, no eye  redness , no  intolerance to light   Respiratory: No wheezing , no  difficulty breathing. Some cough most mornings  Cardiovascular: No CP, no leg swelling , no  Palpitations  GI: no nausea, no vomiting, no diarrhea , no  abdominal pain.  No blood in the stools. No dysphagia, no odynophagia    Endocrine: No polyphagia, no polyuria , no polydipsia  GU: No dysuria, gross hematuria, difficulty urinating. No urinary urgency, no frequency.  Musculoskeletal: No joint swellings or unusual aches or pains  Skin: No change in the color of the skin, palor , no  Rash  Allergic, immunologic: No environmental allergies , no  food allergies  Neurological: No dizziness no  syncope. No headaches. No diplopia, no slurred, no slurred speech, no motor deficits, no facial  Numbness  Hematological: No enlarged lymph nodes, no easy bruising , no unusual bleedings  Psychiatry: No suicidal ideas, no hallucinations, no beavior problems, no confusion.  No unusual/severe anxiety, no depression   Past Medical History  Diagnosis Date  . History of chicken pox   . 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 Elevated prostate specific antigen (PSA)   . 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 Prostate cancer [facility])     Past Surgical History  Procedure Laterality Date  . Tonsillectomy and adenoidectomy    . Elbow surgery      R elbow  . Shoulder surgery      reconstruction  .  Prostate biopsy  01/31/2016    Social History   Social History  . Marital Status: Divorced    Spouse Name: N/A  . Number of Children: 1  . Years of Education: N/A   Occupational History  . carpenter    Social History Main Topics  . Smoking status: Current Every Day Smoker -- 1.00 packs/day for 40 years    Types: Cigarettes  . Smokeless tobacco: Never Used     Comment: 1 ppd   . Alcohol Use: 0.0 oz/week    0 Standard drinks or equivalent per week     Comment: beer after work, 2 /day  . Drug Use: No  . Sexual Activity: Yes   Other Topics Concern  . Not on file   Social History Narrative   Lives by himself     Family History  Problem Relation Age of Onset  . Heart disease Father     F had a MI age 13  . Diabetes Other     GF  . Colon cancer Neg Hx   . Prostate cancer Neg Hx   . Cancer Neg Hx        Medication List       This list is accurate as of: 03/05/16  9:16 AM.  Always use your most recent med list.               EPINEPHrine 0.3 mg/0.3 mL Soaj injection  Commonly known as:  EPI-PEN  Inject 0.3 mLs (0.3 mg total) into the muscle once.     ibuprofen 200 MG tablet  Commonly known as:  ADVIL,MOTRIN  Take 200 mg by mouth every 6 (six) hours as needed for pain, fever or headache. Reported on 03/05/2016     MULTIVITAMIN PO  Take 1 tablet by mouth daily. Reported on 03/05/2016           Objective:   Physical Exam BP 132/78 mmHg  Pulse 75  Temp(Src) 98.1 F (36.7 C) (Oral)  Ht 5\' 6"  (1.676 m)  Wt 115 lb 4 oz (52.277 kg)  BMI 18.61 kg/m2  SpO2 96%  General:   Well developed, well nourished . NAD.  Neck: No  thyromegaly . Normal carotid pulses without bruit HEENT:  Normocephalic . Face symmetric, atraumatic Lungs:  CTA B Normal respiratory effort, no intercostal retractions, no accessory muscle use. Heart: RRR,  no murmur.  No pretibial edema bilaterally  Abdomen:  Not distended, soft, non-tender. No rebound or rigidity.   Skin: Exposed  areas without rash. Not pale. Not jaundice Neurologic:  alert & oriented X3.  Speech normal, gait appropriate for age and unassisted Strength symmetric and appropriate for age.  Psych: Cognition and judgment appear intact.  Cooperative with normal attention span and concentration.  Behavior appropriate. No anxious or depressed appearing.    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 Prostate cancer: has not decide what treatment to take vs surveillance. asx, emotionally doing well RTC 1 year RTC one year

## 2016-03-05 NOTE — Progress Notes (Signed)
Pre visit review using our clinic review tool, if applicable. No additional management support is needed unless otherwise documented below in the visit note. 

## 2016-03-05 NOTE — Assessment & Plan Note (Signed)
Allergy to bee stings: Use a EpiPen since the last office visit, encourage avoidance and continue carrying a EpiPen. Prostate cancer: has not decide what treatment to take vs surveillance. asx, emotionally doing well RTC 1 year

## 2016-03-05 NOTE — Assessment & Plan Note (Addendum)
Td 2014 Zostavax discussed Colon cancer screening: Never had a colonoscopy, currently dealing w/ prostate ca thus will defer CCS for now   Tobacco abuse: Discussed available meds-nicotine supplements. He does see the dentist regularly.   Diet and exercise discussed Labs: CMP, FLP, HIV, CBC. EKG today: NSR, no acute changes

## 2016-03-06 LAB — HIV ANTIBODY (ROUTINE TESTING W REFLEX): HIV 1&2 Ab, 4th Generation: NONREACTIVE

## 2016-05-21 ENCOUNTER — Telehealth: Payer: Self-pay | Admitting: Radiation Oncology

## 2016-05-21 NOTE — Telephone Encounter (Signed)
Received return call from patient. He reports he has an appointment with Dr. Jeffie Pollock tomorrow. He explains he will make further treatment decisions following tomorrow's appt.

## 2016-05-21 NOTE — Telephone Encounter (Signed)
-----   Message from Hayden Pedro, [place] sent at 05/17/2016  4:54 PM EDT ----- Regarding: RE: 04/30/16 His PSA yesterday was 5.24. Can you call him next week to see what he's thinking now that we have this extra data point. Here's Manning's impression/plan from the last visit:  IMPRESSION: This gentleman is a 65 y.o. 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  His T-Stage, Gleason's Score, and PSA put him into the intermediate risk group.  Accordingly he is eligible for a variety of potential treatment options including prostatectomy, external beam radiation, or seed implant.  His prostate volume is small less than 15 cc potentially affecting implant quality with seeds  PLAN: Today I reviewed the findings and workup thus far.  We discussed the natural history of prostate cancer.  We reviewed the the implications of T-stage, Gleason's Score, and PSA on decision-making and outcomes in prostate cancer.  We discussed radiation treatment in the management of prostate cancer with regard to the logistics and delivery of external beam radiation treatment as well as the logistics and delivery of prostate brachytherapy.  We compared and contrasted each of these approaches and also compared these against prostatectomy.  The patient expressed interest in prostate brachytherapy.  However, the patient's prostate size is 12.16 cc, which is small and increases the chance of complications associated with prostate brachytherapy. Upon discussing this with the patient, he would like to make his decision regarding treatment after his scheduled PSA re-check in August. I agreed with this proposal. We will call Mr. Stanard in August to determine a future course of action.  ----- Message ----- From: Hayden Pedro, PA-C Sent: 03/08/2016   7:54 AM To: Hayden Pedro, PA-C Subject: 04/30/16                                       Check with pt. He  wanted seeds but his prostate is 12 cc. Was supposed to have another PSA to decide and wants to wait that long

## 2016-05-21 NOTE — Telephone Encounter (Signed)
Phoned patient to inquire about future course of treatment. No answer. Left message requesting return call.

## 2016-05-24 ENCOUNTER — Telehealth: Payer: Self-pay | Admitting: Radiation Oncology

## 2016-05-24 NOTE — Telephone Encounter (Signed)
Phoned patient to inquire about visit with Dr. Jeffie Pollock and next steps for treatment. No answer. Left messages requesting return call.

## 2016-10-18 ENCOUNTER — Inpatient Hospital Stay (HOSPITAL_COMMUNITY): Payer: BLUE CROSS/BLUE SHIELD

## 2016-10-18 ENCOUNTER — Inpatient Hospital Stay (HOSPITAL_COMMUNITY)
Admission: EM | Admit: 2016-10-18 | Discharge: 2016-10-24 | DRG: 234 | Disposition: A | Discharge status: Home / Self Care | Payer: BLUE CROSS/BLUE SHIELD | Source: Ambulatory Visit | Attending: Cardiothoracic Surgery | Admitting: Cardiothoracic Surgery

## 2016-10-18 ENCOUNTER — Encounter (HOSPITAL_COMMUNITY)
Admission: EM | Disposition: A | Discharge status: Home / Self Care | Payer: Self-pay | Source: Ambulatory Visit | Attending: Cardiothoracic Surgery

## 2016-10-18 ENCOUNTER — Encounter (HOSPITAL_COMMUNITY): Payer: Self-pay | Admitting: Cardiology

## 2016-10-18 DIAGNOSIS — Z8546 Personal history of malignant neoplasm of prostate: Secondary | ICD-10-CM | POA: Diagnosis not present

## 2016-10-18 DIAGNOSIS — J939 Pneumothorax, unspecified: Secondary | ICD-10-CM | POA: Diagnosis not present

## 2016-10-18 DIAGNOSIS — I252 Old myocardial infarction: Secondary | ICD-10-CM

## 2016-10-18 DIAGNOSIS — Z7982 Long term (current) use of aspirin: Secondary | ICD-10-CM | POA: Diagnosis not present

## 2016-10-18 DIAGNOSIS — Z8249 Family history of ischemic heart disease and other diseases of the circulatory system: Secondary | ICD-10-CM

## 2016-10-18 DIAGNOSIS — I25110 Atherosclerotic heart disease of native coronary artery with unstable angina pectoris: Secondary | ICD-10-CM | POA: Diagnosis present

## 2016-10-18 DIAGNOSIS — D62 Acute posthemorrhagic anemia: Secondary | ICD-10-CM | POA: Diagnosis not present

## 2016-10-18 DIAGNOSIS — D696 Thrombocytopenia, unspecified: Secondary | ICD-10-CM | POA: Diagnosis not present

## 2016-10-18 DIAGNOSIS — I2119 ST elevation (STEMI) myocardial infarction involving other coronary artery of inferior wall: Secondary | ICD-10-CM | POA: Diagnosis present

## 2016-10-18 DIAGNOSIS — F17210 Nicotine dependence, cigarettes, uncomplicated: Secondary | ICD-10-CM | POA: Diagnosis present

## 2016-10-18 DIAGNOSIS — D689 Coagulation defect, unspecified: Secondary | ICD-10-CM | POA: Diagnosis not present

## 2016-10-18 DIAGNOSIS — Z01810 Encounter for preprocedural cardiovascular examination: Secondary | ICD-10-CM

## 2016-10-18 DIAGNOSIS — E8770 Fluid overload, unspecified: Secondary | ICD-10-CM | POA: Diagnosis not present

## 2016-10-18 DIAGNOSIS — Z951 Presence of aortocoronary bypass graft: Secondary | ICD-10-CM

## 2016-10-18 DIAGNOSIS — I493 Ventricular premature depolarization: Secondary | ICD-10-CM | POA: Diagnosis not present

## 2016-10-18 DIAGNOSIS — Z9689 Presence of other specified functional implants: Secondary | ICD-10-CM

## 2016-10-18 DIAGNOSIS — R079 Chest pain, unspecified: Secondary | ICD-10-CM | POA: Diagnosis present

## 2016-10-18 HISTORY — PX: CARDIAC CATHETERIZATION: SHX172

## 2016-10-18 HISTORY — DX: Atherosclerotic heart disease of native coronary artery without angina pectoris: I25.10

## 2016-10-18 LAB — APTT: aPTT: >200 seconds (ref 24–36)

## 2016-10-18 LAB — CBC WITH DIFFERENTIAL/PLATELET
Basophils Absolute: 0 10*3/uL (ref 0.0–0.1)
Basophils Relative: 0 %
Eosinophils Absolute: 0.1 10*3/uL (ref 0.0–0.7)
Eosinophils Relative: 2 %
HEMATOCRIT: 39.4 % (ref 39.0–52.0)
HEMOGLOBIN: 14 g/dL (ref 13.0–17.0)
LYMPHS ABS: 1.1 10*3/uL (ref 0.7–4.0)
Lymphocytes Relative: 19 %
MCH: 32.1 pg (ref 26.0–34.0)
MCHC: 35.5 g/dL (ref 30.0–36.0)
MCV: 90.4 fL (ref 78.0–100.0)
MONO ABS: 0.6 10*3/uL (ref 0.1–1.0)
MONOS PCT: 10 %
NEUTROS PCT: 69 %
Neutro Abs: 4 10*3/uL (ref 1.7–7.7)
Platelets: 164 10*3/uL (ref 150–400)
RBC: 4.36 MIL/uL (ref 4.22–5.81)
RDW: 12.6 % (ref 11.5–15.5)
WBC: 5.8 10*3/uL (ref 4.0–10.5)

## 2016-10-18 LAB — BLOOD GAS, ARTERIAL
Acid-Base Excess: 0.3 mmol/L (ref 0.0–2.0)
Bicarbonate: 24.1 mmol/L (ref 20.0–28.0)
Drawn by: 10006
FIO2: 21
O2 Saturation: 96.5 %
Patient temperature: 98.6
pCO2 arterial: 36.5 mmHg (ref 32.0–48.0)
pH, Arterial: 7.435 (ref 7.350–7.450)
pO2, Arterial: 83.5 mmHg (ref 83.0–108.0)

## 2016-10-18 LAB — COMPREHENSIVE METABOLIC PANEL
ALBUMIN: 3.3 g/dL — AB (ref 3.5–5.0)
ALT: 16 U/L — AB (ref 17–63)
ALT: 17 U/L (ref 17–63)
AST: 27 U/L (ref 15–41)
AST: 27 U/L (ref 15–41)
Albumin: 2.8 g/dL — ABNORMAL LOW (ref 3.5–5.0)
Alkaline Phosphatase: 45 U/L (ref 38–126)
Alkaline Phosphatase: 53 U/L (ref 38–126)
Anion gap: 6 (ref 5–15)
Anion gap: 5 (ref 5–15)
BILIRUBIN TOTAL: 0.6 mg/dL (ref 0.3–1.2)
BUN: 12 mg/dL (ref 6–20)
BUN: 11 mg/dL (ref 6–20)
CHLORIDE: 108 mmol/L (ref 101–111)
CHLORIDE: 110 mmol/L (ref 101–111)
CO2: 22 mmol/L (ref 22–32)
CO2: 21 mmol/L — ABNORMAL LOW (ref 22–32)
CREATININE: 0.81 mg/dL (ref 0.61–1.24)
CREATININE: 0.81 mg/dL (ref 0.61–1.24)
Calcium: 7.9 mg/dL — ABNORMAL LOW (ref 8.9–10.3)
Calcium: 8.4 mg/dL — ABNORMAL LOW (ref 8.9–10.3)
GFR calc Af Amer: >60 mL/min (ref >60)
GFR calc Af Amer: >60 mL/min (ref >60)
GLUCOSE: 106 mg/dL — AB (ref 65–99)
Glucose, Bld: 144 mg/dL — ABNORMAL HIGH (ref 65–99)
POTASSIUM: 3.9 mmol/L (ref 3.5–5.1)
POTASSIUM: 4.4 mmol/L (ref 3.5–5.1)
SODIUM: 136 mmol/L (ref 135–145)
Sodium: 136 mmol/L (ref 135–145)
TOTAL PROTEIN: 5.8 g/dL — AB (ref 6.5–8.1)
Total Bilirubin: 0.5 mg/dL (ref 0.3–1.2)
Total Protein: 5.4 g/dL — ABNORMAL LOW (ref 6.5–8.1)

## 2016-10-18 LAB — CBC
HCT: 36.9 % — ABNORMAL LOW (ref 39.0–52.0)
HEMOGLOBIN: 13.2 g/dL (ref 13.0–17.0)
MCH: 32.5 pg (ref 26.0–34.0)
MCHC: 35.8 g/dL (ref 30.0–36.0)
MCV: 90.9 fL (ref 78.0–100.0)
PLATELETS: 151 10*3/uL (ref 150–400)
RBC: 4.06 MIL/uL — AB (ref 4.22–5.81)
RDW: 12.6 % (ref 11.5–15.5)
WBC: 4 10*3/uL (ref 4.0–10.5)

## 2016-10-18 LAB — TROPONIN I
TROPONIN I: 0.24 ng/mL — AB (ref <0.03)
Troponin I: 0.08 ng/mL (ref <0.03)
Troponin I: 0.28 ng/mL (ref <0.03)

## 2016-10-18 LAB — URINALYSIS, ROUTINE W REFLEX MICROSCOPIC
Bilirubin Urine: NEGATIVE
Glucose, UA: 50 mg/dL — AB
Hgb urine dipstick: NEGATIVE
Ketones, ur: NEGATIVE mg/dL
Leukocytes, UA: NEGATIVE
Nitrite: NEGATIVE
Protein, ur: NEGATIVE mg/dL
Specific Gravity, Urine: 1.028 (ref 1.005–1.030)
pH: 5 (ref 5.0–8.0)

## 2016-10-18 LAB — POCT I-STAT, CHEM 8
BUN: 13 mg/dL (ref 6–20)
CHLORIDE: 103 mmol/L (ref 101–111)
CREATININE: 0.7 mg/dL (ref 0.61–1.24)
Calcium, Ion: 1.17 mmol/L (ref 1.15–1.40)
GLUCOSE: 154 mg/dL — AB (ref 65–99)
HEMATOCRIT: 37 % — AB (ref 39.0–52.0)
HEMOGLOBIN: 12.6 g/dL — AB (ref 13.0–17.0)
POTASSIUM: 3.9 mmol/L (ref 3.5–5.1)
Sodium: 138 mmol/L (ref 135–145)
TCO2: 23 mmol/L (ref 0–100)

## 2016-10-18 LAB — LIPID PANEL
CHOL/HDL RATIO: 2.9 ratio
CHOLESTEROL: 188 mg/dL (ref 0–200)
HDL: 64 mg/dL (ref >40)
LDL CALC: 118 mg/dL — AB (ref 0–99)
TRIGLYCERIDES: 30 mg/dL (ref <150)
VLDL: 6 mg/dL (ref 0–40)

## 2016-10-18 LAB — PROTIME-INR
INR: 1.19
INR: 0.99
PROTHROMBIN TIME: 15.2 s (ref 11.4–15.2)
Prothrombin Time: 13.1 seconds (ref 11.4–15.2)

## 2016-10-18 LAB — SURGICAL PCR SCREEN
MRSA, PCR: NEGATIVE
Staphylococcus aureus: POSITIVE — AB

## 2016-10-18 LAB — MRSA PCR SCREENING: MRSA BY PCR: NEGATIVE

## 2016-10-18 LAB — POCT ACTIVATED CLOTTING TIME: Activated Clotting Time: 164 seconds

## 2016-10-18 SURGERY — LEFT HEART CATH AND CORONARY ANGIOGRAPHY
Anesthesia: LOCAL

## 2016-10-18 MED ORDER — BIVALIRUDIN 250 MG IV SOLR
INTRAVENOUS | Status: AC
  Filled 2016-10-18: qty 250

## 2016-10-18 MED ORDER — EPTIFIBATIDE BOLUS VIA INFUSION
180.0000 ug/kg | Freq: Once | INTRAVENOUS | Status: AC
  Administered 2016-10-18: 9400 ug via INTRAVENOUS
  Filled 2016-10-18: qty 13

## 2016-10-18 MED ORDER — HEPARIN (PORCINE) IN NACL 2-0.9 UNIT/ML-% IJ SOLN
INTRAMUSCULAR | Status: DC | PRN
Start: 2016-10-18 — End: 2016-10-18
  Administered 2016-10-18: 1000 mL

## 2016-10-18 MED ORDER — MIDAZOLAM HCL 2 MG/2ML IJ SOLN
INTRAMUSCULAR | Status: AC
  Filled 2016-10-18: qty 2

## 2016-10-18 MED ORDER — NITROGLYCERIN IN D5W 200-5 MCG/ML-% IV SOLN
2.0000 ug/min | INTRAVENOUS | Status: DC
Start: 2016-10-19 — End: 2016-10-19
  Filled 2016-10-18: qty 250

## 2016-10-18 MED ORDER — HEPARIN (PORCINE) IN NACL 100-0.45 UNIT/ML-% IJ SOLN: 600.0000 [IU]/h | INTRAMUSCULAR | Status: DC

## 2016-10-18 MED ORDER — SODIUM CHLORIDE 0.9 % IV SOLN
INTRAVENOUS | Status: DC | PRN
  Administered 2016-10-18: 250 mL via INTRAVENOUS

## 2016-10-18 MED ORDER — HEPARIN (PORCINE) IN NACL 2-0.9 UNIT/ML-% IJ SOLN
INTRAMUSCULAR | Status: AC
Start: 2016-10-18 — End: 2016-10-18
  Filled 2016-10-18: qty 1000

## 2016-10-18 MED ORDER — ASPIRIN EC 81 MG PO TBEC: 81.0000 mg | DELAYED_RELEASE_TABLET | Freq: Every day | ORAL | Status: DC

## 2016-10-18 MED ORDER — CHLORHEXIDINE GLUCONATE CLOTH 2 % EX PADS
6.0000 | MEDICATED_PAD | Freq: Once | CUTANEOUS | Status: AC
  Administered 2016-10-18: 6 via TOPICAL

## 2016-10-18 MED ORDER — ONDANSETRON HCL 4 MG/2ML IJ SOLN: 4.0000 mg | Freq: Four times a day (QID) | INTRAMUSCULAR | Status: DC | PRN

## 2016-10-18 MED ORDER — VANCOMYCIN HCL 10 G IV SOLR
1250.0000 mg | INTRAVENOUS | Status: AC
  Administered 2016-10-19: 1250 mg via INTRAVENOUS
  Filled 2016-10-18: qty 1250

## 2016-10-18 MED ORDER — TEMAZEPAM 7.5 MG PO CAPS
15.0000 mg | ORAL_CAPSULE | Freq: Once | ORAL | Status: AC | PRN
  Administered 2016-10-18: 15 mg via ORAL
  Filled 2016-10-18: qty 2

## 2016-10-18 MED ORDER — SODIUM CHLORIDE 0.9 % IV SOLN
INTRAVENOUS | Status: DC
  Filled 2016-10-18: qty 30

## 2016-10-18 MED ORDER — ASPIRIN 300 MG RE SUPP: 300.0000 mg | RECTAL | Status: DC

## 2016-10-18 MED ORDER — FENTANYL CITRATE (PF) 100 MCG/2ML IJ SOLN
INTRAMUSCULAR | Status: AC
  Filled 2016-10-18: qty 2

## 2016-10-18 MED ORDER — IOPAMIDOL (ISOVUE-370) INJECTION 76%
INTRAVENOUS | Status: AC
  Filled 2016-10-18: qty 100

## 2016-10-18 MED ORDER — DEXTROSE 5 % IV SOLN
1.5000 g | INTRAVENOUS | Status: AC
  Administered 2016-10-19: 1.5 g via INTRAVENOUS
  Filled 2016-10-18: qty 1.5

## 2016-10-18 MED ORDER — TRANEXAMIC ACID 1000 MG/10ML IV SOLN
1.5000 mg/kg/h | INTRAVENOUS | Status: AC
  Administered 2016-10-19: 1.5 mg/kg/h via INTRAVENOUS
  Filled 2016-10-18: qty 25

## 2016-10-18 MED ORDER — NITROGLYCERIN IN D5W 200-5 MCG/ML-% IV SOLN
INTRAVENOUS | Status: AC
  Filled 2016-10-18: qty 250

## 2016-10-18 MED ORDER — SODIUM CHLORIDE 0.9 % IV SOLN
INTRAVENOUS | Status: DC
  Administered 2016-10-18: 13:00:00 via INTRAVENOUS

## 2016-10-18 MED ORDER — IOPAMIDOL (ISOVUE-370) INJECTION 76%
INTRAVENOUS | Status: AC
  Filled 2016-10-18: qty 125

## 2016-10-18 MED ORDER — HEPARIN (PORCINE) IN NACL 100-0.45 UNIT/ML-% IJ SOLN
600.0000 [IU]/h | INTRAMUSCULAR | Status: DC
  Administered 2016-10-18: 600 [IU]/h via INTRAVENOUS
  Filled 2016-10-18: qty 250

## 2016-10-18 MED ORDER — CEFUROXIME SODIUM 750 MG IJ SOLR
750.0000 mg | INTRAMUSCULAR | Status: AC
  Administered 2016-10-19: 750 mg via INTRAVENOUS
  Filled 2016-10-18: qty 750

## 2016-10-18 MED ORDER — FENTANYL CITRATE (PF) 100 MCG/2ML IJ SOLN
INTRAMUSCULAR | Status: DC | PRN
  Administered 2016-10-18: 25 ug via INTRAVENOUS

## 2016-10-18 MED ORDER — MIDAZOLAM HCL 2 MG/2ML IJ SOLN
INTRAMUSCULAR | Status: DC | PRN
  Administered 2016-10-18: 1 mg via INTRAVENOUS

## 2016-10-18 MED ORDER — HEPARIN SODIUM (PORCINE) 1000 UNIT/ML IJ SOLN
INTRAMUSCULAR | Status: AC
  Filled 2016-10-18: qty 1

## 2016-10-18 MED ORDER — SODIUM CHLORIDE 0.9 % IV SOLN: 250.0000 mL | INTRAVENOUS | Status: DC | PRN

## 2016-10-18 MED ORDER — EPINEPHRINE PF 1 MG/ML IJ SOLN
0.0000 ug/min | INTRAVENOUS | Status: DC
  Filled 2016-10-18: qty 4

## 2016-10-18 MED ORDER — EPTIFIBATIDE 75 MG/100ML IV SOLN
2.0000 ug/kg/min | INTRAVENOUS | Status: AC
  Administered 2016-10-18: 2 ug/kg/min via INTRAVENOUS
  Filled 2016-10-18 (×2): qty 100

## 2016-10-18 MED ORDER — PLASMA-LYTE 148 IV SOLN
INTRAVENOUS | Status: AC
  Administered 2016-10-19: 10:00:00
  Filled 2016-10-18: qty 2.5

## 2016-10-18 MED ORDER — METOPROLOL TARTRATE 5 MG/5ML IV SOLN
5.0000 mg | Freq: Once | INTRAVENOUS | Status: AC
  Administered 2016-10-18: 5 mg via INTRAVENOUS
  Filled 2016-10-18: qty 5

## 2016-10-18 MED ORDER — ACETAMINOPHEN 325 MG PO TABS: 650.0000 mg | ORAL_TABLET | ORAL | Status: DC | PRN

## 2016-10-18 MED ORDER — DEXMEDETOMIDINE HCL IN NACL 400 MCG/100ML IV SOLN
0.1000 ug/kg/h | INTRAVENOUS | Status: AC
  Administered 2016-10-19: 0.7 ug/kg/h via INTRAVENOUS
  Filled 2016-10-18: qty 100

## 2016-10-18 MED ORDER — IOPAMIDOL (ISOVUE-370) INJECTION 76%
INTRAVENOUS | Status: DC | PRN
  Administered 2016-10-18: 155 mL via INTRA_ARTERIAL

## 2016-10-18 MED ORDER — NITROGLYCERIN IN D5W 200-5 MCG/ML-% IV SOLN: 5.0000 ug/min | INTRAVENOUS | Status: DC

## 2016-10-18 MED ORDER — HEPARIN SODIUM (PORCINE) 1000 UNIT/ML IJ SOLN
INTRAMUSCULAR | Status: DC | PRN
  Administered 2016-10-18: 3000 [IU] via INTRAVENOUS

## 2016-10-18 MED ORDER — MAGNESIUM SULFATE 50 % IJ SOLN
40.0000 meq | INTRAMUSCULAR | Status: DC
  Filled 2016-10-18: qty 10

## 2016-10-18 MED ORDER — SODIUM CHLORIDE 0.9% FLUSH
3.0000 mL | Freq: Two times a day (BID) | INTRAVENOUS | Status: DC
  Administered 2016-10-18: 3 mL via INTRAVENOUS

## 2016-10-18 MED ORDER — SODIUM CHLORIDE 0.9% FLUSH: 3.0000 mL | INTRAVENOUS | Status: DC | PRN

## 2016-10-18 MED ORDER — POTASSIUM CHLORIDE 2 MEQ/ML IV SOLN
80.0000 meq | INTRAVENOUS | Status: DC
  Filled 2016-10-18: qty 40

## 2016-10-18 MED ORDER — BISACODYL 5 MG PO TBEC
5.0000 mg | DELAYED_RELEASE_TABLET | Freq: Once | ORAL | Status: AC
  Administered 2016-10-18: 5 mg via ORAL
  Filled 2016-10-18: qty 1

## 2016-10-18 MED ORDER — ASPIRIN 81 MG PO CHEW
324.0000 mg | CHEWABLE_TABLET | ORAL | Status: DC
  Filled 2016-10-18: qty 4

## 2016-10-18 MED ORDER — TRANEXAMIC ACID (OHS) PUMP PRIME SOLUTION
2.0000 mg/kg | INTRAVENOUS | Status: DC
  Filled 2016-10-18: qty 1.04

## 2016-10-18 MED ORDER — METOPROLOL TARTRATE 12.5 MG HALF TABLET
12.5000 mg | ORAL_TABLET | Freq: Once | ORAL | Status: AC
  Administered 2016-10-19: 12.5 mg via ORAL
  Filled 2016-10-18: qty 1

## 2016-10-18 MED ORDER — NITROGLYCERIN IN D5W 200-5 MCG/ML-% IV SOLN
INTRAVENOUS | Status: DC | PRN
  Administered 2016-10-18: 5 ug/min via INTRAVENOUS

## 2016-10-18 MED ORDER — TRANEXAMIC ACID (OHS) BOLUS VIA INFUSION
15.0000 mg/kg | INTRAVENOUS | Status: AC
  Administered 2016-10-19: 783 mg via INTRAVENOUS
  Filled 2016-10-18: qty 783

## 2016-10-18 MED ORDER — NITROGLYCERIN 0.4 MG SL SUBL: 0.4000 mg | SUBLINGUAL_TABLET | SUBLINGUAL | Status: DC | PRN

## 2016-10-18 MED ORDER — CHLORHEXIDINE GLUCONATE CLOTH 2 % EX PADS: 6.0000 | MEDICATED_PAD | Freq: Once | CUTANEOUS | Status: AC

## 2016-10-18 MED ORDER — SODIUM CHLORIDE 0.9 % IV SOLN: INTRAVENOUS | Status: AC

## 2016-10-18 MED ORDER — CHLORHEXIDINE GLUCONATE 0.12 % MT SOLN: 15.0000 mL | Freq: Once | OROMUCOSAL | Status: DC

## 2016-10-18 MED ORDER — SODIUM CHLORIDE 0.9 % IV SOLN
INTRAVENOUS | Status: AC
  Administered 2016-10-19: .6 [IU]/h via INTRAVENOUS
  Filled 2016-10-18: qty 2.5

## 2016-10-18 MED ORDER — SODIUM CHLORIDE 0.9 % IV SOLN
30.0000 ug/min | INTRAVENOUS | Status: AC
  Administered 2016-10-19: 50 ug/min via INTRAVENOUS
  Filled 2016-10-18: qty 2

## 2016-10-18 MED ORDER — ATORVASTATIN CALCIUM 80 MG PO TABS
80.0000 mg | ORAL_TABLET | Freq: Every day | ORAL | Status: DC
  Administered 2016-10-18 – 2016-10-22 (×4): 80 mg via ORAL
  Filled 2016-10-18 (×5): qty 1

## 2016-10-18 MED ORDER — DOPAMINE-DEXTROSE 3.2-5 MG/ML-% IV SOLN
0.0000 ug/kg/min | INTRAVENOUS | Status: DC
  Filled 2016-10-18: qty 250

## 2016-10-18 MED ORDER — LIDOCAINE HCL (PF) 1 % IJ SOLN
INTRAMUSCULAR | Status: AC
  Filled 2016-10-18: qty 30

## 2016-10-18 MED ORDER — LIDOCAINE HCL (PF) 1 % IJ SOLN
INTRAMUSCULAR | Status: DC | PRN
  Administered 2016-10-18: 13 mL

## 2016-10-18 SURGICAL SUPPLY — 13 items
CATH INFINITI 5 FR 3DRC (CATHETERS) ×1 IMPLANT
CATH INFINITI 5 FR AR1 MOD (CATHETERS) ×1 IMPLANT
CATH INFINITI 5FR AL1 (CATHETERS) ×1 IMPLANT
CATH INFINITI 5FR MULTPACK ANG (CATHETERS) ×1 IMPLANT
CATH SITESEER 5F NTR (CATHETERS) ×1 IMPLANT
KIT ENCORE 26 ADVANTAGE (KITS) ×1 IMPLANT
KIT HEART LEFT (KITS) ×2 IMPLANT
PACK CARDIAC CATHETERIZATION (CUSTOM PROCEDURE TRAY) ×2 IMPLANT
SHEATH PINNACLE 6F 10CM (SHEATH) ×1 IMPLANT
SYR MEDRAD MARK V 150ML (SYRINGE) ×2 IMPLANT
TRANSDUCER W/STOPCOCK (MISCELLANEOUS) ×2 IMPLANT
WIRE EMERALD 3MM-J .035X150CM (WIRE) ×1 IMPLANT
WIRE EMERALD 3MM-J .035X260CM (WIRE) ×1 IMPLANT

## 2016-10-18 NOTE — Progress Notes (Signed)
Tony for heparin and integrilin Indication: chest pain/ACS  Allergies  Allergen Reactions  . Bee Venom Anaphylaxis    Patient Measurements: Height: 5\' 6"  (167.6 cm) Weight: 115 lb 1.3 oz (52.2 kg) IBW/kg (Calculated) : 63.8  Vital Signs: Temp: 97.4 F (36.3 C) (01/26 1200) Temp Source: Oral (01/26 1200) BP: 115/71 (01/26 1600) Pulse Rate: 60 (01/26 1600)  Labs:  Recent Labs  10/18/16 0948 10/18/16 0952 10/18/16 1224 10/18/16 1839  HGB 13.2 12.6* 14.0  --   HCT 36.9* 37.0* 39.4  --   PLT 151  --  164  --   APTT >200*  --   --   --   LABPROT 15.2  --  13.1  --   INR 1.19  --  0.99  --   CREATININE 0.81 0.70 0.81  --   TROPONINI 0.08*  --  0.24* 0.28*    Estimated Creatinine Clearance: 68 mL/min (by C-G formula based on SCr of 0.81 mg/dL).   Medical History: Past Medical History:  Diagnosis Date  . 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 Elevated prostate specific antigen (PSA)   . History of chicken pox   . 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 Pharmacy consulted for heparin and integrilin dosing to start 8 hours post-sheath removal but continued on Integrilin only per discussion with Jadene Pierini, PA.  Per discussion with Dr. Jonathon Jordan, Pharmacy now consulted to start heparin tonight and d/c Integrilin 8 hours prior to CABG, now scheduled for 1/27 at 0715 (requested Pharmacy enter stop time).   Goal of Therapy:  Heparin level 0.3-0.5 units/mL Monitor platelets by anticoagulation protocol: Yes   Plan:  Start heparin at 600 units/h (no bolus) Continue Integrilin at 2 mcg/kg/min - enter stop time for 8 hours prior to CABG on 1/27 6h heparin level, 8h CBC Daily CBC/heparin level Monitor for s/sx bleeding   Elicia Lamp, PharmD,  BCPS Clinical Pharmacist 10/18/2016 7:54 PM

## 2016-10-18 NOTE — Progress Notes (Signed)
Pre-op Cardiac Surgery  Carotid Findings:   Findings are consistent with a 1-39 percent stenosis involving the right internal carotid artery and the left internal carotid artery. The vertebral arteries demonstrate antegrade flow.  Upper Extremity Right Left  Brachial Pressures 141  Triphasic 137  Triphasic  Radial Waveforms Triphasic Triphasic  Ulnar Waveforms Triphasic Triphasic  Palmar Arch (Allen's Test) Palmar waveforms are obliterated with radial and ulnar waveforms. Palmar waveforms remain within normal limits with radial compression and are diminished greater than fifty percent with ulnar compression.     Lower  Extremity Right Left  Dorsalis Pedis 157  Biphasic 150  Biphasic  Posterior Tibial 144  Biphasic 138  Biphasic  Ankle/Brachial Indices 1.11 1.06   Findings:   Right ABI of 1.11 and left ABI of 1.06 are suggestive of arterial flow within normal limits at rest.

## 2016-10-18 NOTE — Progress Notes (Signed)
1F sheath was pulled at 1045. Vital signs stable before procedure. Pedal pulse +2. Pressure was held for about 7 minutes before an increase in blood flow was noted at the site as well as a small hematoma, about golf ball sized. Manual pressure was taken over by Lurene Shadow, RCIS and hematoma was expelled. Pedal pulses palpated and were still +2, patient's vitals remained stable, color normal for ethnicity.

## 2016-10-18 NOTE — H&P (Signed)
Noah Cochran is an 66 y.o. male.   Chief Complaint: Chest pain HPI: Patient is 66 year old male with past medical history significant for carcinoma of prostate, tobacco abuse 40-pack-years continues to smoke, strong family history of coronary artery disease father and brother died in their 106s due to massive MI, came to [facility] by EMS as code STEMI was called. Patient complained of retrosternal chest pain grade 8/10 associated with diaphoresis shortness of breath radiating to both shoulder while getting ready for work. EKG done on the field initially showed normal sinus rhythm with ST elevation in inferolateral leads and ST depression in anterior leads and reciprocal changes in the high lateral leads suggestive of acute inferoposterolateral wall injury repeat EKG done showed improvement in his ST-T wave changes with resolution of his chest pain. Patient states he has been having chest pain off and on for last 6 months with exertion and released with rest and also after eating food .  Past Medical History:  Diagnosis Date  . 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 Elevated prostate specific antigen (PSA)   . History of chicken pox   . 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 Prostate cancer [facility])     Past Surgical History:  Procedure Laterality Date  . ELBOW SURGERY     R elbow  . PROSTATE BIOPSY  01/31/2016  . SHOULDER SURGERY     reconstruction  . TONSILLECTOMY AND ADENOIDECTOMY      Family History  Problem Relation Age of Onset  . Heart disease Father     F had a MI age 57  . Diabetes Other     GF  . Colon cancer Neg Hx   . Prostate cancer Neg Hx   . Cancer Neg Hx    Social History:  reports that he has been smoking Cigarettes.  He has a 40.00 pack-year smoking history. He has never used smokeless tobacco. He reports that he drinks alcohol. He reports that he does not use drugs.  Allergies:  Allergies  Allergen Reactions  . Bee Venom Anaphylaxis     Medications Prior to Admission  Medication Sig Dispense Refill  . aspirin EC 81 MG tablet Take 81 mg by mouth daily.    Marland Kitchen EPINEPHrine (EPIPEN 2-PAK) 0.3 mg/0.3 mL IJ SOAJ injection Inject 0.3 mLs (0.3 mg total) into the muscle once. 1 Device 2    Results for orders placed or performed during the hospital encounter of 10/18/16 (from the past 48 hour(s))  Lipid panel     Status: Abnormal   Collection Time: 10/18/16  9:48 AM  Result Value Ref Range   Cholesterol 188 0 - 200 mg/dL   Triglycerides 30 <150 mg/dL   HDL 64 >40 mg/dL   Total CHOL/HDL Ratio 2.9 RATIO   VLDL 6 0 - 40 mg/dL   LDL Cholesterol 118 (H) 0 - 99 mg/dL    Comment:        Total Cholesterol/HDL:CHD Risk Coronary Heart Disease Risk Table                     Men   Women  1/2 Average Risk   3.4   3.3  Average Risk       5.0   4.4  2 X Average Risk   9.6   7.1  3 X Average Risk  23.4   11.0        Use the calculated Patient Ratio above and the  CHD Risk Table to determine the patient's CHD Risk.        ATP III CLASSIFICATION (LDL):  <100     mg/dL   Optimal  100-129  mg/dL   Near or Above                    Optimal  130-159  mg/dL   Borderline  160-189  mg/dL   High  >190     mg/dL   Very High   CBC     Status: Abnormal   Collection Time: 10/18/16  9:48 AM  Result Value Ref Range   WBC 4.0 4.0 - 10.5 K/uL   RBC 4.06 (L) 4.22 - 5.81 MIL/uL   Hemoglobin 13.2 13.0 - 17.0 g/dL   HCT 36.9 (L) 39.0 - 52.0 %   MCV 90.9 78.0 - 100.0 fL   MCH 32.5 26.0 - 34.0 pg   MCHC 35.8 30.0 - 36.0 g/dL   RDW 12.6 11.5 - 15.5 %   Platelets 151 150 - 400 K/uL   No results found.  Review of Systems  Constitutional: Negative for fever.  Respiratory: Positive for shortness of breath. Negative for cough and hemoptysis.   Cardiovascular: Positive for chest pain. Negative for palpitations and orthopnea.  Gastrointestinal: Positive for abdominal pain, nausea and vomiting.  Genitourinary: Negative for dysuria.  Neurological:  Negative for dizziness.    Blood pressure 135/77, pulse 71, resp. rate 16, SpO2 99 %. Physical Exam  Constitutional: He is oriented to person, place, and time.  HENT:  Head: Normocephalic and atraumatic.  Eyes: Conjunctivae are normal. Pupils are equal, round, and reactive to light. Left eye exhibits no discharge. No scleral icterus.  Neck: Normal range of motion. Neck supple. No JVD present. No tracheal deviation present. No thyromegaly present.  Cardiovascular: Normal rate and regular rhythm.   Murmur (Soft systolic murmur and S4 gallop noted) heard. Respiratory: Effort normal and breath sounds normal. No respiratory distress. He has no wheezes. He has no rales.  GI: Soft. Bowel sounds are normal. He exhibits no distension. There is no tenderness. There is no rebound and no guarding.  Musculoskeletal: He exhibits no edema, tenderness or deformity.  Neurological: He is alert and oriented to person, place, and time.     Assessment/Plan Acute inferoposterior wall myocardial infarction Tobacco abuse Strong family history of coronary artery disease History of carcinoma of prostate Plan Discussed with patient briefly in the Cath Lab regarding emergency left cath possible PTCA stenting its risk and benefits and consents for PCI.  Charolette Forward, MD 10/18/2016, 10:42 AM

## 2016-10-18 NOTE — Progress Notes (Signed)
ANTICOAGULATION CONSULT NOTE - Initial Consult  Pharmacy Consult for heparin and integrilin Indication: chest pain/ACS  Allergies  Allergen Reactions  . Bee Venom Anaphylaxis    Patient Measurements: Height: 5\' 6"  (167.6 cm) Weight: 115 lb 1.3 oz (52.2 kg) IBW/kg (Calculated) : 63.8  Vital Signs: Temp: 97.4 F (36.3 C) (01/26 1200) Temp Source: Oral (01/26 1200) BP: 131/78 (01/26 1300) Pulse Rate: 65 (01/26 1300)  Labs:  Recent Labs  10/18/16 0948 10/18/16 1224  HGB 13.2 14.0  HCT 36.9* 39.4  PLT 151 164  APTT >200*  --   LABPROT 15.2 13.1  INR 1.19 0.99  CREATININE 0.81 0.81  TROPONINI 0.08* 0.24*    Estimated Creatinine Clearance: 68 mL/min (by C-G formula based on SCr of 0.81 mg/dL).   Medical History: Past Medical History:  Diagnosis Date  . 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 Elevated prostate specific antigen (PSA)   . History of chicken pox   . 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 Prostate cancer [facility])      Assessment: Pt is a 53 YOM brought in as a code STEMI now post-cath found to have severe multivessel CAD and CVTS consulted for possible CABG. Pharmacy consulted for heparin and integrilin dosing to start 8 hours post-sheath removal which occurred at 1045 this morning. Pt received heparin bolus in cath. CBC wnl stable.  Per Dr. Terrence Dupont, will start Integrilin concurrently with heparin if patient does not undergo CABG today 1/26 with the expectation that he will undergo CABG next week.  Goal of Therapy:  Heparin level 0.3-0.5 units/mL Monitor platelets by anticoagulation protocol: Yes   Plan:  Heparin gtt 600 units/hr starting at 1845 Integrilin 9400 mcg bolus x1 Integrilin 2 mcg/kg/min Check heparin level/CBC in 6 hours Daily CBC/heparin level Monitor for s/sx bleeding   Cheral Almas, PharmD Candidate 10/18/2016,2:23 PM

## 2016-10-18 NOTE — Progress Notes (Signed)
Manual pressure applied to right groin for 30 minutes. Groin level 0 , bilateral dp and pt pulses palpable. Tegaderm dressing applied. Bedrest instructions given.   Bedrest begins at 11:30:00

## 2016-10-18 NOTE — Consult Note (Signed)
[address]       [place] [address]             [phone]        Noah Cochran Medical Record [id number] Date of Birth: 06-10-1951  Referring: Dr Terrence Dupont Primary Care: Kathlene November, MD  Chief Complaint: Code STEMI  History of Present Illness:    Patient is a 66 year old male with a past medical history of prostate cancer and substantial tobacco abuse times 40 years with ongoing smoking. He presented via EMS as a code STEMI. He had retrosternal chest pain associated with diaphoresis, shortness of breath with radiation to the shoulders. The pain was described as 8 over 10 in severity. Cardiology consultation was obtained with Dr. Terrence Dupont  took him emergently to the Cath Lab. He was found to have severe multivessel coronary artery disease as described in the report. The circumflex is felt to be the probable culprit lesion. Peak troponin so far 0.24. He has a significant family history of coronary disease.   Patient noted taht pain stopped in EMS on way to ER, Currently he is in CCU without chest pain    Current Activity/ Functional Status: Patient is independent with mobility/ambulation, transfers, ADL's, IADL's.   Zubrod Score: At the time of surgery this patient's most appropriate activity status/level should be described as: [x]     0    Normal activity, no symptoms []     1    Restricted in physical strenuous activity but ambulatory, able to do out light work []     2    Ambulatory and capable of self care, unable to do work activities, up and about                 more than 50%  Of the time                            []     3    Only limited self care, in bed greater than 50% of waking hours []     4    Completely disabled, no self care, confined to bed or chair []     5    Moribund  Past Medical History:  Diagnosis Date  . 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 Elevated prostate specific antigen (PSA)   . History of chicken pox   . Nodular prostate  without urinary obstruction   . Prostate cancer [facility])     Past Surgical History:  Procedure Laterality Date  . CARDIAC CATHETERIZATION N/A 10/18/2016   Procedure: Left Heart Cath and Coronary Angiography;  Surgeon: Charolette Forward, MD;  Location: [facility];  Service: Cardiovascular;  Laterality: N/A;  . ELBOW SURGERY     R elbow  . PROSTATE BIOPSY  01/31/2016  . SHOULDER SURGERY     reconstruction  . TONSILLECTOMY AND ADENOIDECTOMY      History  Smoking Status  . Current Every Day Smoker  . Packs/day: 1.00  . Years: 40.00  . Types: Cigarettes  Smokeless Tobacco  . Never Used    Comment: 1 ppd     History  Alcohol Use  . 0.0 oz/week    Comment: beer after work, 0-2-3 /day    Social History   Social History  . Marital status: Divorced    Spouse name: N/A  . Number of children: 1  . Years of education: N/A  Occupational History  . carpenter- works full time [place]  . Smoking status: Current Every Day Smoker    Packs/day: 1.00    Years: 40.00    Types: Cigarettes  . Smokeless tobacco: Never Used     Comment: 1 ppd   . Alcohol use 0.0 oz/week     Comment: beer after work, 0-2-3 /day  . Drug use: No  . Sexual activity: Yes   Other Topics Concern  . Not on file   Social History Narrative   Lives by himself    Allergies  Allergen Reactions  . Bee Venom Anaphylaxis    Current Facility-Administered Medications  Medication Dose Route Frequency Provider Last Rate Last Dose  . 0.9 %  sodium chloride infusion  250 mL Intravenous PRN Charolette Forward, MD      . 0.9 %  sodium chloride infusion   Intravenous Continuous Charolette Forward, MD 150 mL/hr at 10/18/16 1800    . acetaminophen (TYLENOL) tablet 650 mg  650 mg Oral Q4H PRN Charolette Forward, MD      . aspirin chewable tablet 324 mg  324 mg Oral NOW Charolette Forward, MD       Or  . aspirin suppository 300 mg  300 mg Rectal NOW Charolette Forward, MD      . Derrill Memo ON  10/19/2016] aspirin EC tablet 81 mg  81 mg Oral Daily Charolette Forward, MD      . atorvastatin (LIPITOR) tablet 80 mg  80 mg Oral q1800 Charolette Forward, MD   80 mg at 10/18/16 1804  . eptifibatide (INTEGRILIN) 75 mg / 100 mL (0.75 mg/mL) infusion  2 mcg/kg/min Intravenous Continuous Charolette Forward, MD 8.4 mL/hr at 10/18/16 1804 2 mcg/kg/min at 10/18/16 1804  . nitroGLYCERIN (NITROSTAT) SL tablet 0.4 mg  0.4 mg Sublingual Q5 Min x 3 PRN Charolette Forward, MD      . nitroGLYCERIN 50 mg in dextrose 5 % 250 mL (0.2 mg/mL) infusion  5 mcg/min Intravenous Titrated Charolette Forward, MD 1.5 mL/hr at 10/18/16 1217 5 mcg/min at 10/18/16 1217  . ondansetron (ZOFRAN) injection 4 mg  4 mg Intravenous Q6H PRN Charolette Forward, MD      . sodium chloride flush (NS) 0.9 % injection 3 mL  3 mL Intravenous Q12H Charolette Forward, MD      . sodium chloride flush (NS) 0.9 % injection 3 mL  3 mL Intravenous PRN Charolette Forward, MD        Prescriptions Prior to Admission  Medication Sig Dispense Refill Last Dose  . aspirin EC 81 MG tablet Take 81 mg by mouth daily.   10/18/2016 at Unknown time  . EPINEPHrine (EPIPEN 2-PAK) 0.3 mg/0.3 mL IJ SOAJ injection Inject 0.3 mLs (0.3 mg total) into the muscle once. 1 Device 2     Family History  Problem Relation Age of Onset  . Heart disease Father     F had a MI age 105  . Diabetes Other     GF  . Colon cancer Neg Hx   . Prostate cancer Neg Hx   . Cancer Neg Hx    Procedures   Left Heart Cath and Coronary Angiography  Conclusion    Ost LAD to Prox LAD lesion, 70 %stenosed.  Mid LAD lesion, 90 %stenosed.  Dist LAD lesion, 85 %stenosed.  Prox Cx to Mid Cx lesion, 95 %stenosed.  Mid Cx lesion, 50 %stenosed.  3rd Mrg lesion, 70 %stenosed.  Colon Flattery  RCA lesion, 100 %stenosed.  There is mild left ventricular systolic dysfunction.  The left ventricular ejection fraction is 45-50% by visual estimate.    Indications   Acute MI, inferoposterior wall, initial episode of care ([place])  [I21.19 (ICD-10-CM)]  Procedural Details/Technique   Technical Details Patient was directly brought to the Cath Lab and was placed on fluoroscopy table right groin was prepped and draped in usual fashion. 1% Xylocaine was used for local anesthesia. Patient received 1 mg of Versed and 25 g of fentanyl for conscious sedation total conscious sedation time was 35 minutes patient was monitored closely during the conscious sedation time. With the help of thinwall needle 6 French arterial sheath was placed. Sheath was aspirated and flushed. Next a 65 French left Judkins catheter was advanced over the wire under fluoroscopic guidance up to the ascending aorta wire was pulled out the catheter was aspirated and connected to the manifold catheter was further advanced and engaged into left coronary ostium multiple views of the left system were taken next the catheter was disengaged and was pulled out over the wire and was replaced with 6 French right Judkins catheter which was advanced over the wire under fluoroscopic guidance up to the ascending aorta wire was pulled out the catheter was aspirated and connected to the manifold catheter was further advanced and attempted to engage into right coronary ostium without success this catheter was exchanged to note are followed by 3-D right followed by AL-1 AR-1 without success in cannulating the RCA. Next this catheter was pulled out and was replaced with 6 French pigtail catheter which was advanced over the wire under fluoroscopic guidance up to the ascending aorta catheter was further advanced across aortic valve into the LV LV pressures were recorded next left ventriculogram fever was done in 30 RAO position post angiographic pressures were recorded from LV and then pullback pressures were recorded from the aorta there was no gradient across aortic valve next aortography was done in LAO view which showed no filling of the RCA. Next the pigtail catheter was pulled out over the  wire sheath was aspirated and flushed . Findings LV showed mid and basal inferior wall hypokinesia with EF approximately 45-50% Left main was patent LAD has proximal 70% mid bifurcation 95% stenosis with diagonal 2 and distal 85% stenosis. With TIMI grade 3 distal flow Left circumflex as 90-95% mid calcific/thrombotic stenosis with haziness and moderate distal stenosis mid left circumflex stenosis is the culprit lesion for his EKG findings.. This vessel also has TIMI grade 3 distal flow. OM1 and 2 as moderate stenosis. OM 3 has ostial 70% stenosis. RCA is 100% occluded at the ostium which could not be cannulated and is filling from collaterals from the left system. RCA appears to be chronically occluded. Her All his vessels are tortuous and calcified. Patient tolerated procedure well there were no complications Plan CVT S consult for possible CABG today.   Estimated blood loss <50 mL.  During this procedure the patient was administered the following to achieve and maintain moderate conscious sedation: Versed 1 mg, Fentanyl 25 mcg, while the patient's heart rate, blood pressure, and oxygen saturation were continuously monitored. The period of conscious sedation was 35 minutes, of which I was present face-to-face 100% of this time.    Coronary Findings   Dominance: Right  Left Anterior Descending  Ost LAD to Prox LAD lesion, 70% stenosed.  Mid LAD lesion, 90% stenosed. The lesion is calcified.  Dist LAD lesion, 85% stenosed.  Ramus Intermedius  Vessel is small.  Left Circumflex  Prox Cx to Mid Cx lesion, 95% stenosed. The lesion is discrete and thrombotic. The lesion is calcified.  Mid Cx lesion, 50% stenosed.  First Obtuse Marginal Branch  Vessel is small in size.  Second Obtuse Marginal Branch  Vessel is small in size. There is moderate disease in the vessel.  Third Obtuse Marginal Branch  3rd Mrg lesion, 70% stenosed.  Right Coronary Artery  Ost RCA lesion, 100% stenosed.    Right Posterior Descending Artery  RPDA filled by collaterals from Dist LAD.  Wall Motion              Left Heart   Left Ventricle The left ventricular size is normal. There is mild left ventricular systolic dysfunction. The left ventricular ejection fraction is 45-50% by visual estimate. There are LV function abnormalities.    Coronary Diagrams   Diagnostic Diagram       I have independently reviewed the above  cath films and reviewed the findings with the  patient .   Review of Systems:  Review of Systems  Constitutional: Positive for malaise/fatigue. Negative for chills, diaphoresis, fever and weight loss.  HENT: Positive for hearing loss. Negative for congestion, ear discharge, ear pain, nosebleeds, sinus pain, sore throat and tinnitus.        Mod hearing loss right ear  Eyes: Negative for blurred vision, double vision, photophobia, pain, discharge and redness.  Respiratory: Positive for cough and sputum production. Negative for hemoptysis, shortness of breath, wheezing and stridor.   Cardiovascular: Positive for chest pain. Negative for palpitations, orthopnea, claudication, leg swelling and PND.  Gastrointestinal: Negative for abdominal pain, blood in stool, constipation, diarrhea, heartburn, melena, nausea and vomiting.  Genitourinary: Negative for dysuria, flank pain, frequency, hematuria and urgency.  Musculoskeletal: Negative.   Skin: Negative for itching and rash.  Neurological: Positive for weakness. Negative for dizziness, tingling, tremors, sensory change, speech change, focal weakness, seizures, loss of consciousness and headaches.  Endo/Heme/Allergies: Negative for environmental allergies and polydipsia. Does not bruise/bleed easily.  Psychiatric/Behavioral: Negative for depression, hallucinations, memory loss, substance abuse and suicidal ideas. The patient is not nervous/anxious and does not have insomnia.     Physical Exam: BP 115/71   Pulse 60   Temp  97.4 F (36.3 C) (Oral)   Resp 13   Ht 5\' 6"  (1.676 m)   Wt 115 lb 1.3 oz (52.2 kg)   SpO2 97%   BMI 18.57 kg/m    Physical Exam  Constitutional: No distress. He appears acutely ill.  HENT:  Nose: Nose normal.  Mouth/Throat: Oropharynx is clear. Pharynx is normal.  Has partial   Eyes: Conjunctivae are normal. Pupils are equal, round, and reactive to light.  Neck: Normal range of motion and thyroid normal. No JVD present. No neck adenopathy. No thyromegaly present.  Cardiovascular: Normal rate, regular rhythm, normal heart sounds and intact distal pulses.  Exam reveals no S3, no S4, no distant heart sounds and no midsystolic click.   No murmur heard. Pulmonary/Chest: Breath sounds normal. He has no wheezes. He has no rales.  Abdominal: Soft. Bowel sounds are normal. He exhibits no distension and no mass. There is no splenomegaly or hepatomegaly. There is no tenderness. No hernia.  Musculoskeletal: He exhibits no edema, tenderness or deformity.  Neurological: He is alert and oriented to person, place, and time.  Skin: Skin is dry. No rash noted. No cyanosis. No jaundice or pallor.    Diagnostic Studies &  Laboratory data:     Recent Radiology Findings:   No results found.   I have independently reviewed the above radiologic studies.  Recent Lab Findings: Lab Results  Component Value Date   WBC 5.8 10/18/2016   HGB 14.0 10/18/2016   HCT 39.4 10/18/2016   PLT 164 10/18/2016   GLUCOSE 106 (H) 10/18/2016   CHOL 188 10/18/2016   TRIG 30 10/18/2016   HDL 64 10/18/2016   LDLCALC 118 (H) 10/18/2016   ALT 17 10/18/2016   AST 27 10/18/2016   NA 136 10/18/2016   K 4.4 10/18/2016   CL 110 10/18/2016   CREATININE 0.81 10/18/2016   BUN 11 10/18/2016   CO2 21 (L) 10/18/2016   TSH 1.07 03/07/2015   INR 0.99 10/18/2016      Assessment / Plan:  Severe CAD, + STEMI                                      With symptoms at presentation and severe 3 vessel disease CABG urgently has  been recommended to the patient . Will paln to proceed in am.   The goals risks and alternatives of the planned surgical procedureCABG  have been discussed with the patient in detail. The risks of the procedure including death, infection, stroke, myocardial infarction, bleeding, blood transfusion have all been discussed specifically.  I have quoted Thereasa Distance a 2 % of perioperative mortality and a complication rate as high as 40 %. The patient's questions have been answered.Noah Cochran is willing  to proceed with the planned procedure.  I  spent 40 minutes counseling the patient face to face and 50% or more the  time was spent in counseling and coordination of care. The total time spent in the appointment was 40 minutes.    [name], MD 10/18/2016 6:42 PM

## 2016-10-18 NOTE — Progress Notes (Signed)
Subjective:  Doing well denies any chest pain or shortness of breath. EKG back to baseline troponin minimally elevated.  Objective:  Vital Signs in the last 24 hours: Temp:  [97.4 F (36.3 C)] 97.4 F (36.3 C) (01/26 1200) Pulse Rate:  [0-71] 56 (01/26 1400) Resp:  [0-39] 15 (01/26 1400) BP: (101-141)/(60-90) 135/84 (01/26 1400) SpO2:  [0 %-100 %] 92 % (01/26 1400) Weight:  [115 lb 1.3 oz (52.2 kg)] 115 lb 1.3 oz (52.2 kg) (01/26 1159)  Intake/Output from previous day: No intake/output data recorded. Intake/Output from this shift: Total I/O In: 261.5 [I.V.:261.5] Out: -   Physical Exam: Neck: no adenopathy, no carotid bruit, no JVD and supple, symmetrical, trachea midline Lungs: clear to auscultation bilaterally Heart: regular rate and rhythm, S1, S2 normal and Soft systolic murmur noted no S3 gallop Abdomen: soft, non-tender; bowel sounds normal; no masses,  no organomegaly Extremities: extremities normal, atraumatic, no cyanosis or edema and Right groin stable  Lab Results:  Recent Labs  10/18/16 0948 10/18/16 1224  WBC 4.0 5.8  HGB 13.2 14.0  PLT 151 164    Recent Labs  10/18/16 0948 10/18/16 1224  NA 136 136  K 3.9 4.4  CL 108 110  CO2 22 21*  GLUCOSE 144* 106*  BUN 12 11  CREATININE 0.81 0.81    Recent Labs  10/18/16 0948 10/18/16 1224  TROPONINI 0.08* 0.24*   Hepatic Function Panel  Recent Labs  10/18/16 1224  PROT 5.8*  ALBUMIN 3.3*  AST 27  ALT 17  ALKPHOS 53  BILITOT 0.6    Recent Labs  10/18/16 0948  CHOL 188   No results for input(s): PROTIME in the last 72 hours.  Imaging: Imaging results have been reviewed and No results found.  Cardiac Studies:  Assessment/Plan:  Acute coronary syndrome Multivessel CAD with occluded RCA filling by collaterals from left system History of prior silent MI Tobacco abuse Strong family history of coronary artery disease History of carcinoma of prostate Plan Continue present  management Awaiting CABG Dr. Doylene Canard on-call for weekend  LOS: 0 days    Charolette Forward 10/18/2016, 2:48 PM

## 2016-10-18 NOTE — Progress Notes (Deleted)
ANTICOAGULATION CONSULT NOTE - Initial Consult  Pharmacy Consult for heparin Indication: chest pain/ACS  Allergies  Allergen Reactions  . Bee Venom Anaphylaxis    Patient Measurements:    Pt ht 66 in Pt weight 54.5 kg Heparin Dosing Weight: 54.5 kg  Vital Signs: BP: 138/82 (01/26 1115) Pulse Rate: 62 (01/26 1115)  Labs:  Recent Labs  10/18/16 0948  HGB 13.2  HCT 36.9*  PLT 151  APTT >200*  LABPROT 15.2  INR 1.19  CREATININE 0.81  TROPONINI 0.08*    CrCl cannot be calculated (Unknown ideal weight.).   Medical History: Past Medical History:  Diagnosis Date  . 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 Elevated prostate specific antigen (PSA)   . History of chicken pox   . 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 Prostate cancer [facility])      Assessment: Pt is a 23 YOM brought in for STEMI now post-cath with no interventions done. Pt is a possible CABG candidate. Pharmacy consulted for heparin dosing. Instructed to start gtt 8 hours post-sheath removal which occurred at 1045 this morning.  Goal of Therapy:  Heparin level 0.3-0.5 units/mL Monitor platelets by anticoagulation protocol: Yes   Plan:  Heparin gtt 650 units/hr starting at 1845 F/u 6 hour heparin level/CBC Daily CBC/heparin level Monitor for s/sx bleeding  Cheral Almas, PharmD Candidate 10/18/2016,11:45 AM

## 2016-10-19 ENCOUNTER — Inpatient Hospital Stay (HOSPITAL_COMMUNITY): Payer: BLUE CROSS/BLUE SHIELD

## 2016-10-19 ENCOUNTER — Encounter (HOSPITAL_COMMUNITY): Payer: Self-pay | Admitting: *Deleted

## 2016-10-19 ENCOUNTER — Inpatient Hospital Stay (HOSPITAL_COMMUNITY): Payer: BLUE CROSS/BLUE SHIELD | Admitting: Anesthesiology

## 2016-10-19 ENCOUNTER — Encounter (HOSPITAL_COMMUNITY)
Admission: EM | Disposition: A | Discharge status: Home / Self Care | Payer: Self-pay | Source: Ambulatory Visit | Attending: Cardiothoracic Surgery

## 2016-10-19 DIAGNOSIS — Z951 Presence of aortocoronary bypass graft: Secondary | ICD-10-CM

## 2016-10-19 HISTORY — PX: TEE WITHOUT CARDIOVERSION: SHX5443

## 2016-10-19 HISTORY — PX: CORONARY ARTERY BYPASS GRAFT: SHX141

## 2016-10-19 LAB — POCT I-STAT, CHEM 8
BUN: 9 mg/dL (ref 6–20)
BUN: 5 mg/dL — AB (ref 6–20)
BUN: 9 mg/dL (ref 6–20)
BUN: 6 mg/dL (ref 6–20)
BUN: 6 mg/dL (ref 6–20)
BUN: 5 mg/dL — ABNORMAL LOW (ref 6–20)
CALCIUM ION: 0.99 mmol/L — AB (ref 1.15–1.40)
CALCIUM ION: 1.18 mmol/L (ref 1.15–1.40)
Calcium, Ion: 1.2 mmol/L (ref 1.15–1.40)
Calcium, Ion: 1.02 mmol/L — ABNORMAL LOW (ref 1.15–1.40)
Calcium, Ion: 1.01 mmol/L — ABNORMAL LOW (ref 1.15–1.40)
Calcium, Ion: 1.02 mmol/L — ABNORMAL LOW (ref 1.15–1.40)
Chloride: 107 mmol/L (ref 101–111)
Chloride: 103 mmol/L (ref 101–111)
Chloride: 106 mmol/L (ref 101–111)
Chloride: 104 mmol/L (ref 101–111)
Chloride: 104 mmol/L (ref 101–111)
Chloride: 105 mmol/L (ref 101–111)
Creatinine, Ser: 0.6 mg/dL — ABNORMAL LOW (ref 0.61–1.24)
Creatinine, Ser: 0.4 mg/dL — ABNORMAL LOW (ref 0.61–1.24)
Creatinine, Ser: 0.6 mg/dL — ABNORMAL LOW (ref 0.61–1.24)
Creatinine, Ser: 0.4 mg/dL — ABNORMAL LOW (ref 0.61–1.24)
Creatinine, Ser: 0.5 mg/dL — ABNORMAL LOW (ref 0.61–1.24)
Creatinine, Ser: 0.4 mg/dL — ABNORMAL LOW (ref 0.61–1.24)
GLUCOSE: 90 mg/dL (ref 65–99)
GLUCOSE: 101 mg/dL — AB (ref 65–99)
Glucose, Bld: 87 mg/dL (ref 65–99)
Glucose, Bld: 104 mg/dL — ABNORMAL HIGH (ref 65–99)
Glucose, Bld: 102 mg/dL — ABNORMAL HIGH (ref 65–99)
Glucose, Bld: 133 mg/dL — ABNORMAL HIGH (ref 65–99)
HCT: 35 % — ABNORMAL LOW (ref 39.0–52.0)
HCT: 26 % — ABNORMAL LOW (ref 39.0–52.0)
HCT: 33 % — ABNORMAL LOW (ref 39.0–52.0)
HCT: 26 % — ABNORMAL LOW (ref 39.0–52.0)
HEMATOCRIT: 25 % — AB (ref 39.0–52.0)
HEMATOCRIT: 23 % — AB (ref 39.0–52.0)
HEMOGLOBIN: 11.9 g/dL — AB (ref 13.0–17.0)
HEMOGLOBIN: 11.2 g/dL — AB (ref 13.0–17.0)
HEMOGLOBIN: 8.8 g/dL — AB (ref 13.0–17.0)
HEMOGLOBIN: 8.5 g/dL — AB (ref 13.0–17.0)
HEMOGLOBIN: 7.8 g/dL — AB (ref 13.0–17.0)
Hemoglobin: 8.8 g/dL — ABNORMAL LOW (ref 13.0–17.0)
POTASSIUM: 3.6 mmol/L (ref 3.5–5.1)
Potassium: 4.4 mmol/L (ref 3.5–5.1)
Potassium: 4.1 mmol/L (ref 3.5–5.1)
Potassium: 4.9 mmol/L (ref 3.5–5.1)
Potassium: 4.8 mmol/L (ref 3.5–5.1)
Potassium: 4.8 mmol/L (ref 3.5–5.1)
SODIUM: 142 mmol/L (ref 135–145)
SODIUM: 138 mmol/L (ref 135–145)
SODIUM: 139 mmol/L (ref 135–145)
SODIUM: 140 mmol/L (ref 135–145)
Sodium: 141 mmol/L (ref 135–145)
Sodium: 140 mmol/L (ref 135–145)
TCO2: 29 mmol/L (ref 0–100)
TCO2: 27 mmol/L (ref 0–100)
TCO2: 29 mmol/L (ref 0–100)
TCO2: 27 mmol/L (ref 0–100)
TCO2: 26 mmol/L (ref 0–100)
TCO2: 26 mmol/L (ref 0–100)

## 2016-10-19 LAB — BASIC METABOLIC PANEL
Anion gap: 3 — ABNORMAL LOW (ref 5–15)
BUN: 9 mg/dL (ref 6–20)
CALCIUM: 7.8 mg/dL — AB (ref 8.9–10.3)
CHLORIDE: 111 mmol/L (ref 101–111)
CO2: 24 mmol/L (ref 22–32)
CREATININE: 0.83 mg/dL (ref 0.61–1.24)
GFR calc non Af Amer: >60 mL/min (ref >60)
Glucose, Bld: 86 mg/dL (ref 65–99)
Potassium: 3.7 mmol/L (ref 3.5–5.1)
SODIUM: 138 mmol/L (ref 135–145)

## 2016-10-19 LAB — CBC
HCT: 36.1 % — ABNORMAL LOW (ref 39.0–52.0)
HCT: 29.8 % — ABNORMAL LOW (ref 39.0–52.0)
HCT: 15.6 % — ABNORMAL LOW (ref 39.0–52.0)
Hemoglobin: 12.7 g/dL — ABNORMAL LOW (ref 13.0–17.0)
Hemoglobin: 10.3 g/dL — ABNORMAL LOW (ref 13.0–17.0)
Hemoglobin: 5.5 g/dL — CL (ref 13.0–17.0)
MCH: 31.7 pg (ref 26.0–34.0)
MCH: 31.3 pg (ref 26.0–34.0)
MCH: 31.8 pg (ref 26.0–34.0)
MCHC: 35.2 g/dL (ref 30.0–36.0)
MCHC: 34.6 g/dL (ref 30.0–36.0)
MCHC: 35.3 g/dL (ref 30.0–36.0)
MCV: 90 fL (ref 78.0–100.0)
MCV: 90.6 fL (ref 78.0–100.0)
MCV: 90.2 fL (ref 78.0–100.0)
PLATELETS: 92 10*3/uL — AB (ref 150–400)
Platelets: 157 10*3/uL (ref 150–400)
Platelets: 88 10*3/uL — ABNORMAL LOW (ref 150–400)
RBC: 4.01 MIL/uL — ABNORMAL LOW (ref 4.22–5.81)
RBC: 3.29 MIL/uL — AB (ref 4.22–5.81)
RBC: 1.73 MIL/uL — ABNORMAL LOW (ref 4.22–5.81)
RDW: 12.3 % (ref 11.5–15.5)
RDW: 12.2 % (ref 11.5–15.5)
RDW: 12.8 % (ref 11.5–15.5)
WBC: 6.7 10*3/uL (ref 4.0–10.5)
WBC: 10.1 10*3/uL (ref 4.0–10.5)
WBC: 6.6 10*3/uL (ref 4.0–10.5)

## 2016-10-19 LAB — VAS US DOPPLER PRE CABG
LEFT ECA DIAS: -15 cm/s
LEFT VERTEBRAL DIAS: -8 cm/s
Left CCA dist dias: -19 cm/s
Left CCA dist sys: -68 cm/s
Left CCA prox dias: 15 cm/s
Left CCA prox sys: 74 cm/s
Left ICA dist dias: -31 cm/s
Left ICA dist sys: -69 cm/s
Left ICA prox dias: -18 cm/s
Left ICA prox sys: -54 cm/s
RIGHT ECA DIAS: -20 cm/s
RIGHT VERTEBRAL DIAS: 11 cm/s
Right CCA prox dias: 20 cm/s
Right CCA prox sys: 83 cm/s
Right cca dist sys: -66 cm/s

## 2016-10-19 LAB — HEMOGLOBIN AND HEMATOCRIT, BLOOD
HCT: 27.1 % — ABNORMAL LOW (ref 39.0–52.0)
Hemoglobin: 9.4 g/dL — ABNORMAL LOW (ref 13.0–17.0)

## 2016-10-19 LAB — ECHO INTRAOPERATIVE TEE
Height: 66 in
Height: 66 in
Weight: 1960 oz
Weight: 1960 oz

## 2016-10-19 LAB — POCT I-STAT 3, ART BLOOD GAS (G3+)
ACID-BASE DEFICIT: 1 mmol/L (ref 0.0–2.0)
ACID-BASE DEFICIT: 1 mmol/L (ref 0.0–2.0)
Acid-base deficit: 2 mmol/L (ref 0.0–2.0)
BICARBONATE: 24.2 mmol/L (ref 20.0–28.0)
BICARBONATE: 24.9 mmol/L (ref 20.0–28.0)
Bicarbonate: 24.9 mmol/L (ref 20.0–28.0)
O2 SAT: 100 %
O2 Saturation: 100 %
O2 Saturation: 100 %
PCO2 ART: 46 mmHg (ref 32.0–48.0)
PH ART: 7.337 — AB (ref 7.350–7.450)
PO2 ART: 465 mmHg — AB (ref 83.0–108.0)
PO2 ART: 231 mmHg — AB (ref 83.0–108.0)
TCO2: 26 mmol/L (ref 0–100)
TCO2: 26 mmol/L (ref 0–100)
TCO2: 26 mmol/L (ref 0–100)
pCO2 arterial: 44.3 mmHg (ref 32.0–48.0)
pCO2 arterial: 45.2 mmHg (ref 32.0–48.0)
pH, Arterial: 7.357 (ref 7.350–7.450)
pH, Arterial: 7.342 — ABNORMAL LOW (ref 7.350–7.450)
pO2, Arterial: 469 mmHg — ABNORMAL HIGH (ref 83.0–108.0)

## 2016-10-19 LAB — LIPID PANEL
CHOL/HDL RATIO: 3.2 ratio
CHOLESTEROL: 190 mg/dL (ref 0–200)
HDL: 60 mg/dL (ref >40)
LDL CALC: 115 mg/dL — AB (ref 0–99)
Triglycerides: 76 mg/dL (ref <150)
VLDL: 15 mg/dL (ref 0–40)

## 2016-10-19 LAB — GLUCOSE, CAPILLARY
GLUCOSE-CAPILLARY: 146 mg/dL — AB (ref 65–99)
Glucose-Capillary: 106 mg/dL — ABNORMAL HIGH (ref 65–99)
Glucose-Capillary: 116 mg/dL — ABNORMAL HIGH (ref 65–99)
Glucose-Capillary: 116 mg/dL — ABNORMAL HIGH (ref 65–99)

## 2016-10-19 LAB — POCT I-STAT 4, (NA,K, GLUC, HGB,HCT)
Glucose, Bld: 95 mg/dL (ref 65–99)
HCT: 29 % — ABNORMAL LOW (ref 39.0–52.0)
Hemoglobin: 9.9 g/dL — ABNORMAL LOW (ref 13.0–17.0)
Potassium: 3.8 mmol/L (ref 3.5–5.1)
SODIUM: 143 mmol/L (ref 135–145)

## 2016-10-19 LAB — ABO/RH: ABO/RH(D): AB POS

## 2016-10-19 LAB — MAGNESIUM: Magnesium: 2.6 mg/dL — ABNORMAL HIGH (ref 1.7–2.4)

## 2016-10-19 LAB — PROTIME-INR
INR: 1.58
PROTHROMBIN TIME: 19 s — AB (ref 11.4–15.2)

## 2016-10-19 LAB — HEPARIN LEVEL (UNFRACTIONATED): Heparin Unfractionated: 0.12 IU/mL — ABNORMAL LOW (ref 0.30–0.70)

## 2016-10-19 LAB — PLATELET COUNT: Platelets: 114 10*3/uL — ABNORMAL LOW (ref 150–400)

## 2016-10-19 LAB — APTT: APTT: 39 s — AB (ref 24–36)

## 2016-10-19 LAB — CREATININE, SERUM
Creatinine, Ser: 0.74 mg/dL (ref 0.61–1.24)
GFR calc Af Amer: >60 mL/min (ref >60)
GFR calc non Af Amer: >60 mL/min (ref >60)

## 2016-10-19 LAB — PREPARE RBC (CROSSMATCH)

## 2016-10-19 LAB — HEMOGLOBIN A1C
Hgb A1c MFr Bld: 5.2 % (ref 4.8–5.6)
MEAN PLASMA GLUCOSE: 103 mg/dL

## 2016-10-19 LAB — TROPONIN I: Troponin I: 0.24 ng/mL (ref <0.03)

## 2016-10-19 SURGERY — CORONARY ARTERY BYPASS GRAFTING (CABG)
Anesthesia: General | Site: Chest

## 2016-10-19 MED ORDER — DOCUSATE SODIUM 100 MG PO CAPS
200.0000 mg | ORAL_CAPSULE | Freq: Every day | ORAL | Status: DC
  Administered 2016-10-20 – 2016-10-22 (×3): 200 mg via ORAL
  Filled 2016-10-19 (×3): qty 2

## 2016-10-19 MED ORDER — METOPROLOL TARTRATE 25 MG/10 ML ORAL SUSPENSION
12.5000 mg | Freq: Two times a day (BID) | ORAL | Status: DC
  Filled 2016-10-19: qty 5

## 2016-10-19 MED ORDER — EPHEDRINE 5 MG/ML INJ
INTRAVENOUS | Status: AC
  Filled 2016-10-19: qty 10

## 2016-10-19 MED ORDER — MIDAZOLAM HCL 5 MG/ML IJ SOLN
INTRAMUSCULAR | Status: DC | PRN
  Administered 2016-10-19: 2 mg via INTRAVENOUS
  Administered 2016-10-19: 1 mg via INTRAVENOUS
  Administered 2016-10-19: 2 mg via INTRAVENOUS
  Administered 2016-10-19: 4 mg via INTRAVENOUS
  Administered 2016-10-19: 3 mg via INTRAVENOUS

## 2016-10-19 MED ORDER — FENTANYL CITRATE (PF) 250 MCG/5ML IJ SOLN
INTRAMUSCULAR | Status: AC
  Filled 2016-10-19: qty 5

## 2016-10-19 MED ORDER — TRAMADOL HCL 50 MG PO TABS
50.0000 mg | ORAL_TABLET | ORAL | Status: DC | PRN
  Administered 2016-10-20 – 2016-10-22 (×4): 100 mg via ORAL
  Filled 2016-10-19 (×4): qty 2

## 2016-10-19 MED ORDER — ALBUMIN HUMAN 5 % IV SOLN
250.0000 mL | INTRAVENOUS | Status: AC | PRN
  Administered 2016-10-19: 250 mL via INTRAVENOUS

## 2016-10-19 MED ORDER — SODIUM CHLORIDE 0.45 % IV SOLN
INTRAVENOUS | Status: DC | PRN
  Administered 2016-10-19: 20:00:00 via INTRAVENOUS

## 2016-10-19 MED ORDER — SODIUM CHLORIDE 0.9 % IV SOLN
20.0000 ug | Freq: Once | INTRAVENOUS | Status: AC
  Administered 2016-10-19: 20 ug via INTRAVENOUS
  Filled 2016-10-19: qty 5

## 2016-10-19 MED ORDER — SODIUM CHLORIDE 0.9 % IV SOLN
INTRAVENOUS | Status: DC
  Administered 2016-10-19: 20 mL/h via INTRAVENOUS

## 2016-10-19 MED ORDER — INSULIN ASPART 100 UNIT/ML ~~LOC~~ SOLN
0.0000 [IU] | SUBCUTANEOUS | Status: DC
  Administered 2016-10-19: 2 [IU] via SUBCUTANEOUS

## 2016-10-19 MED ORDER — FENTANYL CITRATE (PF) 250 MCG/5ML IJ SOLN
INTRAMUSCULAR | Status: AC
  Filled 2016-10-19: qty 20

## 2016-10-19 MED ORDER — LIDOCAINE 2% (20 MG/ML) 5 ML SYRINGE
INTRAMUSCULAR | Status: DC | PRN
  Administered 2016-10-19: 60 mg via INTRAVENOUS

## 2016-10-19 MED ORDER — LEVALBUTEROL HCL 0.63 MG/3ML IN NEBU
0.6300 mg | INHALATION_SOLUTION | Freq: Three times a day (TID) | RESPIRATORY_TRACT | Status: DC
  Administered 2016-10-19 – 2016-10-23 (×11): 0.63 mg via RESPIRATORY_TRACT
  Filled 2016-10-19 (×11): qty 3

## 2016-10-19 MED ORDER — BISACODYL 10 MG RE SUPP: 10.0000 mg | Freq: Every day | RECTAL | Status: DC

## 2016-10-19 MED ORDER — HEPARIN (PORCINE) IN NACL 100-0.45 UNIT/ML-% IJ SOLN
700.0000 [IU]/h | INTRAMUSCULAR | Status: DC
  Administered 2016-10-19: 700 [IU]/h via INTRAVENOUS

## 2016-10-19 MED ORDER — VANCOMYCIN HCL IN DEXTROSE 1-5 GM/200ML-% IV SOLN
1000.0000 mg | Freq: Once | INTRAVENOUS | Status: AC
  Administered 2016-10-19: 1000 mg via INTRAVENOUS
  Filled 2016-10-19: qty 200

## 2016-10-19 MED ORDER — MIDAZOLAM HCL 2 MG/2ML IJ SOLN
INTRAMUSCULAR | Status: AC
  Filled 2016-10-19: qty 2

## 2016-10-19 MED ORDER — HEPARIN SODIUM (PORCINE) 1000 UNIT/ML IJ SOLN
INTRAMUSCULAR | Status: DC | PRN
  Administered 2016-10-19: 28000 [IU] via INTRAVENOUS

## 2016-10-19 MED ORDER — LACTATED RINGERS IV SOLN
INTRAVENOUS | Status: DC | PRN
  Administered 2016-10-19 (×2): via INTRAVENOUS

## 2016-10-19 MED ORDER — DOPAMINE-DEXTROSE 1.6-5 MG/ML-% IV SOLN
INTRAVENOUS | Status: DC | PRN
  Administered 2016-10-19: 5 ug/kg/min via INTRAVENOUS

## 2016-10-19 MED ORDER — CHLORHEXIDINE GLUCONATE 0.12 % MT SOLN
15.0000 mL | Freq: Two times a day (BID) | OROMUCOSAL | Status: DC
  Administered 2016-10-19: 15 mL via OROMUCOSAL

## 2016-10-19 MED ORDER — CALCIUM CHLORIDE 10 % IV SOLN
INTRAVENOUS | Status: AC
  Filled 2016-10-19: qty 10

## 2016-10-19 MED ORDER — FENTANYL CITRATE (PF) 100 MCG/2ML IJ SOLN
INTRAMUSCULAR | Status: AC
  Filled 2016-10-19: qty 2

## 2016-10-19 MED ORDER — ROCURONIUM BROMIDE 10 MG/ML (PF) SYRINGE
PREFILLED_SYRINGE | INTRAVENOUS | Status: DC | PRN
  Administered 2016-10-19 (×5): 50 mg via INTRAVENOUS

## 2016-10-19 MED ORDER — MIDAZOLAM HCL 10 MG/2ML IJ SOLN
INTRAMUSCULAR | Status: AC
  Filled 2016-10-19: qty 2

## 2016-10-19 MED ORDER — HEMOSTATIC AGENTS (NO CHARGE) OPTIME
TOPICAL | Status: DC | PRN
  Administered 2016-10-19: 1 via TOPICAL

## 2016-10-19 MED ORDER — LACTATED RINGERS IV SOLN
INTRAVENOUS | Status: DC | PRN
  Administered 2016-10-19 (×2): via INTRAVENOUS

## 2016-10-19 MED ORDER — SUCCINYLCHOLINE CHLORIDE 200 MG/10ML IV SOSY
PREFILLED_SYRINGE | INTRAVENOUS | Status: AC
  Filled 2016-10-19: qty 10

## 2016-10-19 MED ORDER — ASPIRIN EC 325 MG PO TBEC: 325.0000 mg | DELAYED_RELEASE_TABLET | Freq: Every day | ORAL | Status: DC

## 2016-10-19 MED ORDER — NITROGLYCERIN IN D5W 200-5 MCG/ML-% IV SOLN: 0.0000 ug/min | INTRAVENOUS | Status: DC

## 2016-10-19 MED ORDER — METOPROLOL TARTRATE 12.5 MG HALF TABLET
12.5000 mg | ORAL_TABLET | Freq: Two times a day (BID) | ORAL | Status: DC
  Administered 2016-10-21 – 2016-10-22 (×3): 12.5 mg via ORAL
  Filled 2016-10-19 (×3): qty 1

## 2016-10-19 MED ORDER — PROPOFOL 10 MG/ML IV BOLUS
INTRAVENOUS | Status: DC | PRN
  Administered 2016-10-19: 100 mg via INTRAVENOUS
  Administered 2016-10-19: 40 mg via INTRAVENOUS

## 2016-10-19 MED ORDER — LACTATED RINGERS IV SOLN: INTRAVENOUS | Status: DC

## 2016-10-19 MED ORDER — PROTAMINE SULFATE 10 MG/ML IV SOLN
INTRAVENOUS | Status: AC
  Filled 2016-10-19: qty 5

## 2016-10-19 MED ORDER — ORAL CARE MOUTH RINSE: 15.0000 mL | Freq: Two times a day (BID) | OROMUCOSAL | Status: DC

## 2016-10-19 MED ORDER — ACETAMINOPHEN 160 MG/5ML PO SOLN: 650.0000 mg | Freq: Once | ORAL | Status: AC

## 2016-10-19 MED ORDER — PROTAMINE SULFATE 10 MG/ML IV SOLN
25.0000 mg | Freq: Once | INTRAVENOUS | Status: AC
  Administered 2016-10-19: 25 mg via INTRAVENOUS
  Filled 2016-10-19: qty 5

## 2016-10-19 MED ORDER — MORPHINE SULFATE (PF) 2 MG/ML IV SOLN
2.0000 mg | INTRAVENOUS | Status: DC | PRN
  Administered 2016-10-20 – 2016-10-21 (×4): 2 mg via INTRAVENOUS
  Filled 2016-10-19 (×2): qty 1
  Filled 2016-10-19: qty 2
  Filled 2016-10-19 (×2): qty 1

## 2016-10-19 MED ORDER — OXYCODONE HCL 5 MG PO TABS: 5.0000 mg | ORAL_TABLET | ORAL | Status: DC | PRN

## 2016-10-19 MED ORDER — LACTATED RINGERS IV SOLN
INTRAVENOUS | Status: DC
  Administered 2016-10-20: 04:00:00 via INTRAVENOUS

## 2016-10-19 MED ORDER — MORPHINE SULFATE (PF) 2 MG/ML IV SOLN
1.0000 mg | INTRAVENOUS | Status: DC | PRN
  Administered 2016-10-19 – 2016-10-20 (×5): 4 mg via INTRAVENOUS
  Filled 2016-10-19 (×4): qty 2

## 2016-10-19 MED ORDER — PHENYLEPHRINE HCL 10 MG/ML IJ SOLN
INTRAVENOUS | Status: DC | PRN
  Administered 2016-10-19: 40 ug/min via INTRAVENOUS

## 2016-10-19 MED ORDER — SODIUM CHLORIDE 0.9 % IV SOLN
Freq: Once | INTRAVENOUS | Status: AC
  Administered 2016-10-19: 21:00:00 via INTRAVENOUS

## 2016-10-19 MED ORDER — ASPIRIN 81 MG PO CHEW: 324.0000 mg | CHEWABLE_TABLET | Freq: Every day | ORAL | Status: DC

## 2016-10-19 MED ORDER — ROCURONIUM BROMIDE 50 MG/5ML IV SOSY
PREFILLED_SYRINGE | INTRAVENOUS | Status: AC
  Filled 2016-10-19: qty 5

## 2016-10-19 MED ORDER — SODIUM CHLORIDE 0.9 % IV SOLN
INTRAVENOUS | Status: DC
  Filled 2016-10-19: qty 2.5

## 2016-10-19 MED ORDER — SUCCINYLCHOLINE CHLORIDE 200 MG/10ML IV SOSY
PREFILLED_SYRINGE | INTRAVENOUS | Status: DC | PRN
  Administered 2016-10-19: 120 mg via INTRAVENOUS

## 2016-10-19 MED ORDER — ACETAMINOPHEN 650 MG RE SUPP
650.0000 mg | Freq: Once | RECTAL | Status: AC
  Administered 2016-10-19: 650 mg via RECTAL

## 2016-10-19 MED ORDER — MUPIROCIN 2 % EX OINT
1.0000 "application " | TOPICAL_OINTMENT | Freq: Two times a day (BID) | CUTANEOUS | Status: DC
  Administered 2016-10-19 – 2016-10-22 (×7): 1 via NASAL
  Filled 2016-10-19 (×4): qty 22

## 2016-10-19 MED ORDER — PROTAMINE SULFATE 10 MG/ML IV SOLN
INTRAVENOUS | Status: DC | PRN
  Administered 2016-10-19: 250 mg via INTRAVENOUS
  Administered 2016-10-19: 25 mg via INTRAVENOUS

## 2016-10-19 MED ORDER — CHLORHEXIDINE GLUCONATE CLOTH 2 % EX PADS
6.0000 | MEDICATED_PAD | Freq: Every day | CUTANEOUS | Status: DC
  Administered 2016-10-19 – 2016-10-22 (×4): 6 via TOPICAL

## 2016-10-19 MED ORDER — SODIUM CHLORIDE 0.9 % IV SOLN
Freq: Once | INTRAVENOUS | Status: AC
  Administered 2016-10-19: 17:00:00 via INTRAVENOUS

## 2016-10-19 MED ORDER — LACTATED RINGERS IV SOLN
500.0000 mL | Freq: Once | INTRAVENOUS | Status: DC | PRN
Start: 2016-10-19 — End: 2016-10-22

## 2016-10-19 MED ORDER — BISACODYL 5 MG PO TBEC
10.0000 mg | DELAYED_RELEASE_TABLET | Freq: Every day | ORAL | Status: DC
  Administered 2016-10-20 – 2016-10-22 (×3): 10 mg via ORAL
  Filled 2016-10-19 (×4): qty 2

## 2016-10-19 MED ORDER — PROPOFOL 10 MG/ML IV BOLUS
INTRAVENOUS | Status: AC
  Filled 2016-10-19: qty 20

## 2016-10-19 MED ORDER — ALBUTEROL SULFATE HFA 108 (90 BASE) MCG/ACT IN AERS
INHALATION_SPRAY | RESPIRATORY_TRACT | Status: DC | PRN
  Administered 2016-10-19: 4 via RESPIRATORY_TRACT

## 2016-10-19 MED ORDER — POTASSIUM CHLORIDE 2 MEQ/ML IV SOLN
30.0000 meq | Freq: Once | INTRAVENOUS | Status: AC
  Administered 2016-10-19: 30 meq via INTRAVENOUS
  Filled 2016-10-19: qty 15

## 2016-10-19 MED ORDER — PROTAMINE SULFATE 10 MG/ML IV SOLN
INTRAVENOUS | Status: AC
Start: 2016-10-19 — End: 2016-10-19
  Filled 2016-10-19: qty 25

## 2016-10-19 MED ORDER — METOPROLOL TARTRATE 5 MG/5ML IV SOLN: 2.5000 mg | INTRAVENOUS | Status: DC | PRN

## 2016-10-19 MED ORDER — CHLORHEXIDINE GLUCONATE 0.12 % MT SOLN
15.0000 mL | OROMUCOSAL | Status: AC
  Administered 2016-10-19: 15 mL via OROMUCOSAL

## 2016-10-19 MED ORDER — ALBUTEROL SULFATE HFA 108 (90 BASE) MCG/ACT IN AERS
INHALATION_SPRAY | RESPIRATORY_TRACT | Status: AC
  Filled 2016-10-19: qty 6.7

## 2016-10-19 MED ORDER — DEXTROSE 5 % IV SOLN
1.5000 g | Freq: Two times a day (BID) | INTRAVENOUS | Status: AC
  Administered 2016-10-19 – 2016-10-21 (×4): 1.5 g via INTRAVENOUS
  Filled 2016-10-19 (×4): qty 1.5

## 2016-10-19 MED ORDER — INSULIN REGULAR BOLUS VIA INFUSION
0.0000 [IU] | Freq: Three times a day (TID) | INTRAVENOUS | Status: DC
Start: 2016-10-19 — End: 2016-10-19
  Filled 2016-10-19: qty 10

## 2016-10-19 MED ORDER — LACTATED RINGERS IV SOLN
INTRAVENOUS | Status: DC | PRN
  Administered 2016-10-19 (×2): via INTRAVENOUS

## 2016-10-19 MED ORDER — LIDOCAINE 2% (20 MG/ML) 5 ML SYRINGE
INTRAMUSCULAR | Status: AC
  Filled 2016-10-19: qty 5

## 2016-10-19 MED ORDER — ROCURONIUM BROMIDE 50 MG/5ML IV SOSY
PREFILLED_SYRINGE | INTRAVENOUS | Status: AC
  Filled 2016-10-19: qty 10

## 2016-10-19 MED ORDER — SODIUM CHLORIDE 0.9% FLUSH
3.0000 mL | Freq: Two times a day (BID) | INTRAVENOUS | Status: DC
  Administered 2016-10-20 – 2016-10-22 (×4): 3 mL via INTRAVENOUS

## 2016-10-19 MED ORDER — HEPARIN SODIUM (PORCINE) 1000 UNIT/ML IJ SOLN
INTRAMUSCULAR | Status: AC
  Filled 2016-10-19: qty 1

## 2016-10-19 MED ORDER — FENTANYL CITRATE (PF) 250 MCG/5ML IJ SOLN
INTRAMUSCULAR | Status: DC | PRN
  Administered 2016-10-19: 50 ug via INTRAVENOUS
  Administered 2016-10-19: 250 ug via INTRAVENOUS
  Administered 2016-10-19: 100 ug via INTRAVENOUS
  Administered 2016-10-19: 250 ug via INTRAVENOUS
  Administered 2016-10-19: 200 ug via INTRAVENOUS
  Administered 2016-10-19 (×2): 150 ug via INTRAVENOUS
  Administered 2016-10-19: 350 ug via INTRAVENOUS

## 2016-10-19 MED ORDER — ALBUMIN HUMAN 5 % IV SOLN
INTRAVENOUS | Status: DC | PRN
  Administered 2016-10-19 (×3): via INTRAVENOUS

## 2016-10-19 MED ORDER — ACETAMINOPHEN 500 MG PO TABS
1000.0000 mg | ORAL_TABLET | Freq: Four times a day (QID) | ORAL | Status: DC
  Administered 2016-10-20 – 2016-10-24 (×12): 1000 mg via ORAL
  Filled 2016-10-19 (×14): qty 2

## 2016-10-19 MED ORDER — SODIUM CHLORIDE 0.9% FLUSH: 3.0000 mL | INTRAVENOUS | Status: DC | PRN

## 2016-10-19 MED ORDER — ACETAMINOPHEN 160 MG/5ML PO SOLN
1000.0000 mg | Freq: Four times a day (QID) | ORAL | Status: DC
  Administered 2016-10-19: 1000 mg
  Filled 2016-10-19: qty 40.6

## 2016-10-19 MED ORDER — FAMOTIDINE IN NACL 20-0.9 MG/50ML-% IV SOLN
20.0000 mg | Freq: Two times a day (BID) | INTRAVENOUS | Status: AC
  Administered 2016-10-19 (×2): 20 mg via INTRAVENOUS
  Filled 2016-10-19: qty 50

## 2016-10-19 MED ORDER — ONDANSETRON HCL 4 MG/2ML IJ SOLN
4.0000 mg | Freq: Four times a day (QID) | INTRAMUSCULAR | Status: DC | PRN
  Administered 2016-10-20: 4 mg via INTRAVENOUS
  Filled 2016-10-19: qty 2

## 2016-10-19 MED ORDER — MIDAZOLAM HCL 2 MG/2ML IJ SOLN
2.0000 mg | INTRAMUSCULAR | Status: DC | PRN
  Administered 2016-10-19 (×4): 2 mg via INTRAVENOUS
  Filled 2016-10-19 (×7): qty 2

## 2016-10-19 MED ORDER — SODIUM CHLORIDE 0.9 % IV SOLN
0.0000 ug/min | INTRAVENOUS | Status: DC
  Administered 2016-10-20: 15 ug/min via INTRAVENOUS
  Filled 2016-10-19 (×2): qty 2

## 2016-10-19 MED ORDER — SODIUM CHLORIDE 0.9 % IV SOLN: 250.0000 mL | INTRAVENOUS | Status: DC

## 2016-10-19 MED ORDER — MAGNESIUM SULFATE 4 GM/100ML IV SOLN
4.0000 g | Freq: Once | INTRAVENOUS | Status: AC
  Administered 2016-10-19: 4 g via INTRAVENOUS
  Filled 2016-10-19: qty 100

## 2016-10-19 MED ORDER — SODIUM CHLORIDE 0.9 % IV SOLN
INTRAVENOUS | Status: DC | PRN
  Administered 2016-10-19: 14:00:00 via INTRAVENOUS

## 2016-10-19 MED ORDER — PANTOPRAZOLE SODIUM 40 MG PO TBEC
40.0000 mg | DELAYED_RELEASE_TABLET | Freq: Every day | ORAL | Status: DC
  Administered 2016-10-21 – 2016-10-24 (×4): 40 mg via ORAL
  Filled 2016-10-19 (×5): qty 1

## 2016-10-19 MED ORDER — MIDAZOLAM HCL 2 MG/2ML IJ SOLN
INTRAMUSCULAR | Status: AC
Start: 2016-10-19 — End: 2016-10-19
  Filled 2016-10-19: qty 2

## 2016-10-19 MED ORDER — EPHEDRINE SULFATE 50 MG/ML IJ SOLN
INTRAMUSCULAR | Status: DC | PRN
  Administered 2016-10-19: 5 mg via INTRAVENOUS

## 2016-10-19 MED ORDER — DEXMEDETOMIDINE HCL IN NACL 200 MCG/50ML IV SOLN
0.0000 ug/kg/h | INTRAVENOUS | Status: DC
  Administered 2016-10-19 (×2): 0.7 ug/kg/h via INTRAVENOUS
  Filled 2016-10-19 (×2): qty 50

## 2016-10-19 SURGICAL SUPPLY — 78 items
ADH SKN CLS APL DERMABOND .7 (GAUZE/BANDAGES/DRESSINGS) ×2
AGENT HMST KT MTR STRL THRMB (HEMOSTASIS) ×2
BAG DECANTER FOR FLEXI CONT (MISCELLANEOUS) ×3 IMPLANT
BANDAGE ACE 4X5 VEL STRL LF (GAUZE/BANDAGES/DRESSINGS) ×3 IMPLANT
BANDAGE ACE 6X5 VEL STRL LF (GAUZE/BANDAGES/DRESSINGS) ×3 IMPLANT
BANDAGE GAUZE 4  KLING STR (GAUZE/BANDAGES/DRESSINGS) ×1 IMPLANT
BLADE CLIPPER SURG (BLADE) ×1 IMPLANT
BLADE STERNUM SYSTEM 6 (BLADE) ×3 IMPLANT
BNDG GAUZE ELAST 4 BULKY (GAUZE/BANDAGES/DRESSINGS) ×3 IMPLANT
CANISTER SUCTION 2500CC (MISCELLANEOUS) ×3 IMPLANT
CATH CPB KIT GERHARDT (MISCELLANEOUS) ×3 IMPLANT
CATH THORACIC 28FR (CATHETERS) ×3 IMPLANT
COVER PROBE W GEL 5X96 (DRAPES) ×1 IMPLANT
CRADLE DONUT ADULT HEAD (MISCELLANEOUS) ×3 IMPLANT
DERMABOND ADVANCED (GAUZE/BANDAGES/DRESSINGS) ×1
DERMABOND ADVANCED .7 DNX12 (GAUZE/BANDAGES/DRESSINGS) IMPLANT
DRAIN CHANNEL 28F RND 3/8 FF (WOUND CARE) ×3 IMPLANT
DRAPE CARDIOVASCULAR INCISE (DRAPES) ×3
DRAPE SLUSH/WARMER DISC (DRAPES) ×3 IMPLANT
DRAPE SRG 135X102X78XABS (DRAPES) ×2 IMPLANT
DRSG AQUACEL AG ADV 3.5X14 (GAUZE/BANDAGES/DRESSINGS) ×3 IMPLANT
ELECT BLADE 4.0 EZ CLEAN MEGAD (MISCELLANEOUS) ×3
ELECT REM PT RETURN 9FT ADLT (ELECTROSURGICAL) ×6
ELECTRODE BLDE 4.0 EZ CLN MEGD (MISCELLANEOUS) ×2 IMPLANT
ELECTRODE REM PT RTRN 9FT ADLT (ELECTROSURGICAL) ×4 IMPLANT
FELT TEFLON 1X6 (MISCELLANEOUS) ×6 IMPLANT
GAUZE SPONGE 4X4 12PLY STRL (GAUZE/BANDAGES/DRESSINGS) ×6 IMPLANT
GLOVE BIO SURGEON STRL SZ 6 (GLOVE) ×2 IMPLANT
GLOVE BIO SURGEON STRL SZ 6.5 (GLOVE) ×14 IMPLANT
GLOVE BIO SURGEON STRL SZ7.5 (GLOVE) ×3 IMPLANT
GLOVE BIOGEL PI IND STRL 6 (GLOVE) IMPLANT
GLOVE BIOGEL PI INDICATOR 6 (GLOVE) ×1
GOWN STRL REUS W/ TWL LRG LVL3 (GOWN DISPOSABLE) ×8 IMPLANT
GOWN STRL REUS W/TWL LRG LVL3 (GOWN DISPOSABLE) ×12
HEMOSTAT POWDER SURGIFOAM 1G (HEMOSTASIS) ×9 IMPLANT
HEMOSTAT SURGICEL 2X14 (HEMOSTASIS) ×3 IMPLANT
KIT BASIN OR (CUSTOM PROCEDURE TRAY) ×3 IMPLANT
KIT CATH SUCT 8FR (CATHETERS) ×3 IMPLANT
KIT ROOM TURNOVER OR (KITS) ×3 IMPLANT
KIT SUCTION CATH 14FR (SUCTIONS) ×6 IMPLANT
KIT VASOVIEW HEMOPRO VH 3000 (KITS) ×3 IMPLANT
LEAD PACING MYOCARDI (MISCELLANEOUS) ×4 IMPLANT
MARKER GRAFT CORONARY BYPASS (MISCELLANEOUS) ×9 IMPLANT
NS IRRIG 1000ML POUR BTL (IV SOLUTION) ×15 IMPLANT
PACK OPEN HEART (CUSTOM PROCEDURE TRAY) ×3 IMPLANT
PAD ARMBOARD 7.5X6 YLW CONV (MISCELLANEOUS) ×6 IMPLANT
PAD ELECT DEFIB RADIOL ZOLL (MISCELLANEOUS) ×3 IMPLANT
PENCIL BUTTON HOLSTER BLD 10FT (ELECTRODE) ×3 IMPLANT
PUNCH AORTIC ROTATE  4.5MM 8IN (MISCELLANEOUS) ×1 IMPLANT
SET CARDIOPLEGIA MPS 5001102 (MISCELLANEOUS) ×1 IMPLANT
SPONGE GAUZE 4X4 12PLY STER LF (GAUZE/BANDAGES/DRESSINGS) ×2 IMPLANT
SPONGE LAP 18X18 X RAY DECT (DISPOSABLE) ×3 IMPLANT
SURGIFLO W/THROMBIN 8M KIT (HEMOSTASIS) ×1 IMPLANT
SUT BONE WAX W31G (SUTURE) ×3 IMPLANT
SUT PROLENE 3 0 SH DA (SUTURE) ×1 IMPLANT
SUT PROLENE 3 0 SH1 36 (SUTURE) ×4 IMPLANT
SUT PROLENE 4 0 TF (SUTURE) ×6 IMPLANT
SUT PROLENE 6 0 CC (SUTURE) ×8 IMPLANT
SUT PROLENE 7 0 BV1 MDA (SUTURE) ×5 IMPLANT
SUT PROLENE 8 0 BV175 6 (SUTURE) ×3 IMPLANT
SUT SILK 2 0 SH CR/8 (SUTURE) ×1 IMPLANT
SUT STEEL 6MS V (SUTURE) ×3 IMPLANT
SUT STEEL SZ 6 DBL 3X14 BALL (SUTURE) ×4 IMPLANT
SUT VIC AB 1 CTX 18 (SUTURE) ×6 IMPLANT
SUT VIC AB 2-0 CT1 27 (SUTURE) ×3
SUT VIC AB 2-0 CT1 TAPERPNT 27 (SUTURE) IMPLANT
SUT VIC AB 3-0 X1 27 (SUTURE) ×1 IMPLANT
SUTURE E-PAK OPEN HEART (SUTURE) ×3 IMPLANT
SYSTEM SAHARA CHEST DRAIN ATS (WOUND CARE) ×3 IMPLANT
TAPE CLOTH SURG 4X10 WHT LF (GAUZE/BANDAGES/DRESSINGS) ×1 IMPLANT
TOWEL OR 17X24 6PK STRL BLUE (TOWEL DISPOSABLE) ×6 IMPLANT
TOWEL OR 17X26 10 PK STRL BLUE (TOWEL DISPOSABLE) ×6 IMPLANT
TRAY CATH LUMEN 1 20CM STRL (SET/KITS/TRAYS/PACK) ×1 IMPLANT
TRAY FOLEY IC TEMP SENS 16FR (CATHETERS) ×3 IMPLANT
TUBING ART PRESS 48 MALE/FEM (TUBING) ×2 IMPLANT
TUBING INSUFFLATION (TUBING) ×3 IMPLANT
UNDERPAD 30X30 (UNDERPADS AND DIAPERS) ×3 IMPLANT
WATER STERILE IRR 1000ML POUR (IV SOLUTION) ×6 IMPLANT

## 2016-10-19 NOTE — Anesthesia Procedure Notes (Addendum)
Central Venous Catheter Insertion Performed by: Roderic Palau, anesthesiologist Start/End1/27/2018 6:52 AM, 10/19/2016 7:02 AM Patient location: Pre-op. Preanesthetic checklist: patient identified, IV checked, site marked, risks and benefits discussed, surgical consent, monitors and equipment checked, pre-op evaluation, timeout performed and anesthesia consent Position: Trendelenburg Lidocaine 1% used for infiltration and patient sedated Hand hygiene performed , maximum sterile barriers used  and Seldinger technique used Catheter size: 8.5 Fr Total catheter length 10. Central line and PA cath was placed.Sheath introducer Swan type:thermodilution PA Cath depth:50 Procedure performed using ultrasound guided technique. Ultrasound Notes:anatomy identified, needle tip was noted to be adjacent to the nerve/plexus identified, no ultrasound evidence of intravascular and/or intraneural injection and image(s) printed for medical record Attempts: 1 Following insertion, line sutured and dressing applied. Post procedure assessment: blood return through all ports, free fluid flow and no air  Patient tolerated the procedure well with no immediate complications.

## 2016-10-19 NOTE — Brief Op Note (Addendum)
      [address]       [place] [address]             [phone]       10/19/2016  2:15 PM  PATIENT:  Noah Cochran  66 y.o. male  PRE-OPERATIVE DIAGNOSIS:  CAD/STIME  POST-OPERATIVE DIAGNOSIS:  Some  PROCEDURE:  Procedure(s): CORONARY ARTERY BYPASS GRAFTING (CABG) times five using left internal mammary artery and right saphenous vein.  Left mammary artery to Left anterior descending coronary artery, saphenous vein to diagonal, sequential vein to obtuse marginal and distal circumflex arteries, and vein graft to distal right coronary artery. (N/A) TRANSESOPHAGEAL ECHOCARDIOGRAM (TEE) (N/A) LIMA-LAD SEQ SVG-OM-DIST CX SVG-DIAG SVG-DISTRCA placment of right femoral a line with sonosite    SURGEON:  Surgeon(s) and Role:    * [name], MD - Primary  PHYSICIAN ASSISTANT: GOLD PA-C  ASSISTANTS: none   ANESTHESIA:   general  EBL:  Total I/O In: 4670 [I.V.:3500; Blood:420; IV Piggyback:750] Out: 3070 [Urine:2070; Blood:1000]  BLOOD ADMINISTERED:none  DRAINS: 3 CHEST TUBES   LOCAL MEDICATIONS USED:  NONE  SPECIMEN:  No Specimen  DISPOSITION OF SPECIMEN:  N/A  COUNTS:  YES   DICTATION: .Other Dictation: Dictation Number PENDING  PLAN OF CARE: Admit to inpatient   PATIENT DISPOSITION:  ICU - intubated and hemodynamically stable.   Delay start of Pharmacological VTE agent (>24hrs) due to surgical blood loss or risk of bleeding: yes  COMPLICATIONS: NO KNOWN

## 2016-10-19 NOTE — OR Nursing (Signed)
1st call to SICU 1316.  2nd call to SICU 1351

## 2016-10-19 NOTE — Anesthesia Preprocedure Evaluation (Addendum)
Anesthesia Evaluation  Patient identified by MRN, date of birth, ID band Patient awake    Reviewed: Allergy & Precautions, NPO status , Patient's Chart, lab work & pertinent test results  Airway Mallampati: II       Dental   Pulmonary Current Smoker,    breath sounds clear to auscultation       Cardiovascular + CAD and + Past MI   Rhythm:Regular Rate:Normal     Neuro/Psych    GI/Hepatic negative GI ROS, Neg liver ROS,   Endo/Other  negative endocrine ROS  Renal/GU negative Renal ROS     Musculoskeletal   Abdominal   Peds  Hematology   Anesthesia Other Findings   Reproductive/Obstetrics                            Anesthesia Physical Anesthesia Plan  ASA: III  Anesthesia Plan: General   Post-op Pain Management:    Induction: Intravenous  Airway Management Planned: Oral ETT  Additional Equipment: PA Cath, TEE and Arterial line  Intra-op Plan:   Post-operative Plan: Post-operative intubation/ventilation  Informed Consent: I have reviewed the patients History and Physical, chart, labs and discussed the procedure including the risks, benefits and alternatives for the proposed anesthesia with the patient or authorized representative who has indicated his/her understanding and acceptance.   Dental advisory given  Plan Discussed with: CRNA, Anesthesiologist and Surgeon  Anesthesia Plan Comments:         Anesthesia Quick Evaluation

## 2016-10-19 NOTE — Interval H&P Note (Signed)
History and Physical Interval Note:  10/19/2016 8:34 AM  Noah Cochran  has presented today for surgery, with the diagnosis of CAD  The various methods of treatment have been discussed with the patient and family. After consideration of risks, benefits and other options for treatment, the patient has consented to  Procedure(s): CORONARY ARTERY BYPASS GRAFTING (CABG) (N/A) TRANSESOPHAGEAL ECHOCARDIOGRAM (TEE) (N/A) as a surgical intervention .  The patient's history has been reviewed, patient examined, no change in status, stable for surgery.  I have reviewed the patient's chart and labs.  Questions were answered to the patient's satisfaction.   Lab Results  Component Value Date   WBC 6.7 10/19/2016   HGB 12.7 (L) 10/19/2016   HCT 36.1 (L) 10/19/2016   PLT 157 10/19/2016   GLUCOSE 86 10/19/2016   CHOL 190 10/19/2016   TRIG 76 10/19/2016   HDL 60 10/19/2016   LDLCALC 115 (H) 10/19/2016   ALT 17 10/18/2016   AST 27 10/18/2016   NA 138 10/19/2016   K 3.7 10/19/2016   CL 111 10/19/2016   CREATININE 0.83 10/19/2016   BUN 9 10/19/2016   CO2 24 10/19/2016   TSH 1.07 03/07/2015   PSA 5.87 (H) 12/04/2015   INR 0.99 10/18/2016   HGBA1C 5.2 10/18/2016     [name]

## 2016-10-19 NOTE — Transfer of Care (Signed)
Immediate Anesthesia Transfer of Care Note  Patient: Noah Cochran  Procedure(s) Performed: Procedure(s): CORONARY ARTERY BYPASS GRAFTING (CABG) times five using left internal mammary artery and right saphenous vein.  Left mammary artery to Left anterior descending coronary artery, saphenous vein to diagonal, sequential vein to obtuse marginal and distal circumflex arteries, and vein graft to distal right coronary artery. (N/A) TRANSESOPHAGEAL ECHOCARDIOGRAM (TEE) (N/A)  Patient Location: ICU  Anesthesia Type:General  Level of Consciousness: sedated, unresponsive and Patient remains intubated per anesthesia plan  Airway & Oxygen Therapy: Patient remains intubated per anesthesia plan and Patient placed on Ventilator (see vital sign flow sheet for setting)  Post-op Assessment: Report given to RN and Post -op Vital signs reviewed and stable  Post vital signs: Reviewed and stable  Last Vitals:  Vitals:   10/19/16 0500 10/19/16 0524  BP: (!) 98/59 (!) 144/86  Pulse: (!) 58 74  Resp:    Temp:      Last Pain:  Vitals:   10/19/16 0000  TempSrc: Oral         Complications: No apparent anesthesia complications

## 2016-10-19 NOTE — Progress Notes (Signed)
[facility] for heparin Indication: chest pain/ACS  Allergies  Allergen Reactions  . Bee Venom Anaphylaxis    Patient Measurements: Height: 5\' 6"  (167.6 cm) Weight: 115 lb 1.3 oz (52.2 kg) IBW/kg (Calculated) : 63.8  Vital Signs: Temp: 97.8 F (36.6 C) (01/27 0000) Temp Source: Oral (01/27 0000) BP: 114/74 (01/27 0300) Pulse Rate: 57 (01/27 0300)  Labs:  Recent Labs  10/18/16 0948 10/18/16 0952 10/18/16 1224 10/18/16 1839 10/18/16 2348 10/19/16 0304  HGB 13.2 12.6* 14.0  --   --  12.7*  HCT 36.9* 37.0* 39.4  --   --  36.1*  PLT 151  --  164  --   --  157  APTT >200*  --   --   --   --   --   LABPROT 15.2  --  13.1  --   --   --   INR 1.19  --  0.99  --   --   --   HEPARINUNFRC  --   --   --   --   --  0.12*  CREATININE 0.81 0.70 0.81  --   --   --   TROPONINI 0.08*  --  0.24* 0.28* 0.24*  --     Estimated Creatinine Clearance: 68 mL/min (by C-G formula based on SCr of 0.81 mg/dL).   Medical History: Past Medical History:  Diagnosis Date  . 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 Elevated prostate specific antigen (PSA)   . History of chicken pox   . 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 Prostate cancer [facility])      Assessment: Pt is a 22 YOM brought in as a code STEMI, now post-cath found to have severe multivessel CAD and CVTS consulted for  CABG.  Pharmacy was consulted to start heparin tonight and d/c Integrilin 8 hours prior to CABG, scheduled for 1/27 at 0715.  Integrilin drip stopped @23 :24 per EPIC charting in prep for CABG today.  6  Hr heparin level is 0.12 on heparin 600 units/hr.  CBC low/stable   Goal of Therapy:  Heparin level 0.3-0.7 units/mL Monitor platelets by anticoagulation protocol: Yes   Plan:  Increase heparin drip to 700 units/h (no bolus) Daily CBC/heparin level Monitor for s/sx bleeding  Nicole Cella, RPh Clinical Pharmacist Pager: [phone] 10/19/2016 3:47 AM

## 2016-10-19 NOTE — H&P (View-Only) (Signed)
[address]       [place], [place] [address]             [phone]        Noah Cochran [place] Medical Record [id number] Date of Birth: 16-Sep-1951  Referring: Dr Noah Cochran Primary Care: Noah November, MD  Chief Complaint: Code STEMI  History of Present Illness:    Patient is a 66 year old male with a past medical history of prostate cancer and substantial tobacco abuse times 40 years with ongoing smoking. He presented via EMS as a code STEMI. He had retrosternal chest pain associated with diaphoresis, shortness of breath with radiation to the shoulders. The pain was described as 8 over 10 in severity. Cardiology consultation was obtained with Dr. Terrence Cochran  took him emergently to the Cath Lab. He was found to have severe multivessel coronary artery disease as described in the report. The circumflex is felt to be the probable culprit lesion. Peak troponin so far 0.24. He has a significant family history of coronary disease.   Patient noted taht pain stopped in EMS on way to ER, Currently he is in CCU without chest pain    Current Activity/ Functional Status: Patient is independent with mobility/ambulation, transfers, ADL's, IADL's.   Zubrod Score: At the time of surgery this patient's most appropriate activity status/level should be described as: [x]     0    Normal activity, no symptoms []     1    Restricted in physical strenuous activity but ambulatory, able to do out light work []     2    Ambulatory and capable of self care, unable to do work activities, up and about                 more than 50%  Of the time                            []     3    Only limited self care, in bed greater than 50% of waking hours []     4    Completely disabled, no self care, confined to bed or chair []     5    Moribund  Past Medical History:  Diagnosis Date  . 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 Elevated prostate specific antigen (PSA)   . History of chicken pox   . Nodular prostate  without urinary obstruction   . Prostate cancer [facility])     Past Surgical History:  Procedure Laterality Date  . CARDIAC CATHETERIZATION N/A 10/18/2016   Procedure: Left Heart Cath and Coronary Angiography;  Surgeon: Noah Forward, MD;  Location: [facility];  Service: Cardiovascular;  Laterality: N/A;  . ELBOW SURGERY     R elbow  . PROSTATE BIOPSY  01/31/2016  . SHOULDER SURGERY     reconstruction  . TONSILLECTOMY AND ADENOIDECTOMY      History  Smoking Status  . Current Every Day Smoker  . Packs/day: 1.00  . Years: 40.00  . Types: Cigarettes  Smokeless Tobacco  . Never Used    Comment: 1 ppd     History  Alcohol Use  . 0.0 oz/week    Comment: beer after work, 0-2-3 /day    Social History   Social History  . Marital status: Divorced    Spouse name: N/A  . Number of children: 1  . Years of education: N/A  Occupational History  . carpenter- works full time [employer]  . Smoking status: Current Every Day Smoker    Packs/day: 1.00    Years: 40.00    Types: Cigarettes  . Smokeless tobacco: Never Used     Comment: 1 ppd   . Alcohol use 0.0 oz/week     Comment: beer after work, 0-2-3 /day  . Drug use: No  . Sexual activity: Yes   Other Topics Concern  . Not on file   Social History Narrative   Lives by himself    Allergies  Allergen Reactions  . Bee Venom Anaphylaxis    Current Facility-Administered Medications  Medication Dose Route Frequency Provider Last Rate Last Dose  . 0.9 %  sodium chloride infusion  250 mL Intravenous PRN Noah Forward, MD      . 0.9 %  sodium chloride infusion   Intravenous Continuous Noah Forward, MD 150 mL/hr at 10/18/16 1800    . acetaminophen (TYLENOL) tablet 650 mg  650 mg Oral Q4H PRN Noah Forward, MD      . aspirin chewable tablet 324 mg  324 mg Oral NOW Noah Forward, MD       Or  . aspirin suppository 300 mg  300 mg Rectal NOW Noah Forward, MD      . Derrill Memo ON  10/19/2016] aspirin EC tablet 81 mg  81 mg Oral Daily Noah Forward, MD      . atorvastatin (LIPITOR) tablet 80 mg  80 mg Oral q1800 Noah Forward, MD   80 mg at 10/18/16 1804  . eptifibatide (INTEGRILIN) 75 mg / 100 mL (0.75 mg/mL) infusion  2 mcg/kg/min Intravenous Continuous Noah Forward, MD 8.4 mL/hr at 10/18/16 1804 2 mcg/kg/min at 10/18/16 1804  . nitroGLYCERIN (NITROSTAT) SL tablet 0.4 mg  0.4 mg Sublingual Q5 Min x 3 PRN Noah Forward, MD      . nitroGLYCERIN 50 mg in dextrose 5 % 250 mL (0.2 mg/mL) infusion  5 mcg/min Intravenous Titrated Noah Forward, MD 1.5 mL/hr at 10/18/16 1217 5 mcg/min at 10/18/16 1217  . ondansetron (ZOFRAN) injection 4 mg  4 mg Intravenous Q6H PRN Noah Forward, MD      . sodium chloride flush (NS) 0.9 % injection 3 mL  3 mL Intravenous Q12H Noah Forward, MD      . sodium chloride flush (NS) 0.9 % injection 3 mL  3 mL Intravenous PRN Noah Forward, MD        Prescriptions Prior to Admission  Medication Sig Dispense Refill Last Dose  . aspirin EC 81 MG tablet Take 81 mg by mouth daily.   10/18/2016 at Unknown time  . EPINEPHrine (EPIPEN 2-PAK) 0.3 mg/0.3 mL IJ SOAJ injection Inject 0.3 mLs (0.3 mg total) into the muscle once. 1 Device 2     Family History  Problem Relation Age of Onset  . Heart disease Father     F had a MI age 4  . Diabetes Other     GF  . Colon cancer Neg Hx   . Prostate cancer Neg Hx   . Cancer Neg Hx    Procedures   Left Heart Cath and Coronary Angiography  Conclusion    Ost LAD to Prox LAD lesion, 70 %stenosed.  Mid LAD lesion, 90 %stenosed.  Dist LAD lesion, 85 %stenosed.  Prox Cx to Mid Cx lesion, 95 %stenosed.  Mid Cx lesion, 50 %stenosed.  3rd Mrg lesion, 70 %stenosed.  Colon Flattery  RCA lesion, 100 %stenosed.  There is mild left ventricular systolic dysfunction.  The left ventricular ejection fraction is 45-50% by visual estimate.    Indications   Acute MI, inferoposterior wall, initial episode of care ([place])  [I21.19 (ICD-10-CM)]  Procedural Details/Technique   Technical Details Patient was directly brought to the Cath Lab and was placed on fluoroscopy table right groin was prepped and draped in usual fashion. 1% Xylocaine was used for local anesthesia. Patient received 1 mg of Versed and 25 g of fentanyl for conscious sedation total conscious sedation time was 35 minutes patient was monitored closely during the conscious sedation time. With the help of thinwall needle 6 French arterial sheath was placed. Sheath was aspirated and flushed. Next a 74 French left Judkins catheter was advanced over the wire under fluoroscopic guidance up to the ascending aorta wire was pulled out the catheter was aspirated and connected to the manifold catheter was further advanced and engaged into left coronary ostium multiple views of the left system were taken next the catheter was disengaged and was pulled out over the wire and was replaced with 6 French right Judkins catheter which was advanced over the wire under fluoroscopic guidance up to the ascending aorta wire was pulled out the catheter was aspirated and connected to the manifold catheter was further advanced and attempted to engage into right coronary ostium without success this catheter was exchanged to note are followed by 3-D right followed by AL-1 AR-1 without success in cannulating the RCA. Next this catheter was pulled out and was replaced with 6 French pigtail catheter which was advanced over the wire under fluoroscopic guidance up to the ascending aorta catheter was further advanced across aortic valve into the LV LV pressures were recorded next left ventriculogram fever was done in 30 RAO position post angiographic pressures were recorded from LV and then pullback pressures were recorded from the aorta there was no gradient across aortic valve next aortography was done in LAO view which showed no filling of the RCA. Next the pigtail catheter was pulled out over the  wire sheath was aspirated and flushed . Findings LV showed mid and basal inferior wall hypokinesia with EF approximately 45-50% Left main was patent LAD has proximal 70% mid bifurcation 95% stenosis with diagonal 2 and distal 85% stenosis. With TIMI grade 3 distal flow Left circumflex as 90-95% mid calcific/thrombotic stenosis with haziness and moderate distal stenosis mid left circumflex stenosis is the culprit lesion for his EKG findings.. This vessel also has TIMI grade 3 distal flow. OM1 and 2 as moderate stenosis. OM 3 has ostial 70% stenosis. RCA is 100% occluded at the ostium which could not be cannulated and is filling from collaterals from the left system. RCA appears to be chronically occluded. Her All his vessels are tortuous and calcified. Patient tolerated procedure well there were no complications Plan CVT S consult for possible CABG today.   Estimated blood loss <50 mL.  During this procedure the patient was administered the following to achieve and maintain moderate conscious sedation: Versed 1 mg, Fentanyl 25 mcg, while the patient's heart rate, blood pressure, and oxygen saturation were continuously monitored. The period of conscious sedation was 35 minutes, of which I was present face-to-face 100% of this time.    Coronary Findings   Dominance: Right  Left Anterior Descending  Ost LAD to Prox LAD lesion, 70% stenosed.  Mid LAD lesion, 90% stenosed. The lesion is calcified.  Dist LAD lesion, 85% stenosed.  Ramus Intermedius  Vessel is small.  Left Circumflex  Prox Cx to Mid Cx lesion, 95% stenosed. The lesion is discrete and thrombotic. The lesion is calcified.  Mid Cx lesion, 50% stenosed.  First Obtuse Marginal Branch  Vessel is small in size.  Second Obtuse Marginal Branch  Vessel is small in size. There is moderate disease in the vessel.  Third Obtuse Marginal Branch  3rd Mrg lesion, 70% stenosed.  Right Coronary Artery  Ost RCA lesion, 100% stenosed.    Right Posterior Descending Artery  RPDA filled by collaterals from Dist LAD.  Wall Motion              Left Heart   Left Ventricle The left ventricular size is normal. There is mild left ventricular systolic dysfunction. The left ventricular ejection fraction is 45-50% by visual estimate. There are LV function abnormalities.    Coronary Diagrams   Diagnostic Diagram       I have independently reviewed the above  cath films and reviewed the findings with the  patient .   Review of Systems:  Review of Systems  Constitutional: Positive for malaise/fatigue. Negative for chills, diaphoresis, fever and weight loss.  HENT: Positive for hearing loss. Negative for congestion, ear discharge, ear pain, nosebleeds, sinus pain, sore throat and tinnitus.        Mod hearing loss right ear  Eyes: Negative for blurred vision, double vision, photophobia, pain, discharge and redness.  Respiratory: Positive for cough and sputum production. Negative for hemoptysis, shortness of breath, wheezing and stridor.   Cardiovascular: Positive for chest pain. Negative for palpitations, orthopnea, claudication, leg swelling and PND.  Gastrointestinal: Negative for abdominal pain, blood in stool, constipation, diarrhea, heartburn, melena, nausea and vomiting.  Genitourinary: Negative for dysuria, flank pain, frequency, hematuria and urgency.  Musculoskeletal: Negative.   Skin: Negative for itching and rash.  Neurological: Positive for weakness. Negative for dizziness, tingling, tremors, sensory change, speech change, focal weakness, seizures, loss of consciousness and headaches.  Endo/Heme/Allergies: Negative for environmental allergies and polydipsia. Does not bruise/bleed easily.  Psychiatric/Behavioral: Negative for depression, hallucinations, memory loss, substance abuse and suicidal ideas. The patient is not nervous/anxious and does not have insomnia.     Physical Exam: BP 115/71   Pulse 60   Temp  97.4 F (36.3 C) (Oral)   Resp 13   Ht 5\' 6"  (1.676 m)   Wt 115 lb 1.3 oz (52.2 kg)   SpO2 97%   BMI 18.57 kg/m    Physical Exam  Constitutional: No distress. He appears acutely ill.  HENT:  Nose: Nose normal.  Mouth/Throat: Oropharynx is clear. Pharynx is normal.  Has partial   Eyes: Conjunctivae are normal. Pupils are equal, round, and reactive to light.  Neck: Normal range of motion and thyroid normal. No JVD present. No neck adenopathy. No thyromegaly present.  Cardiovascular: Normal rate, regular rhythm, normal heart sounds and intact distal pulses.  Exam reveals no S3, no S4, no distant heart sounds and no midsystolic click.   No murmur heard. Pulmonary/Chest: Breath sounds normal. He has no wheezes. He has no rales.  Abdominal: Soft. Bowel sounds are normal. He exhibits no distension and no mass. There is no splenomegaly or hepatomegaly. There is no tenderness. No hernia.  Musculoskeletal: He exhibits no edema, tenderness or deformity.  Neurological: He is alert and oriented to person, place, and time.  Skin: Skin is dry. No rash noted. No cyanosis. No jaundice or pallor.    Diagnostic Studies &  Laboratory data:     Recent Radiology Findings:   No results found.   I have independently reviewed the above radiologic studies.  Recent Lab Findings: Lab Results  Component Value Date   WBC 5.8 10/18/2016   HGB 14.0 10/18/2016   HCT 39.4 10/18/2016   PLT 164 10/18/2016   GLUCOSE 106 (H) 10/18/2016   CHOL 188 10/18/2016   TRIG 30 10/18/2016   HDL 64 10/18/2016   LDLCALC 118 (H) 10/18/2016   ALT 17 10/18/2016   AST 27 10/18/2016   NA 136 10/18/2016   K 4.4 10/18/2016   CL 110 10/18/2016   CREATININE 0.81 10/18/2016   BUN 11 10/18/2016   CO2 21 (L) 10/18/2016   TSH 1.07 03/07/2015   INR 0.99 10/18/2016      Assessment / Plan:  Severe CAD, + STEMI                                      With symptoms at presentation and severe 3 vessel disease CABG urgently has  been recommended to the patient . Will paln to proceed in am.   The goals risks and alternatives of the planned surgical procedureCABG  have been discussed with the patient in detail. The risks of the procedure including death, infection, stroke, myocardial infarction, bleeding, blood transfusion have all been discussed specifically.  I have quoted Thereasa Distance a 2 % of perioperative mortality and a complication rate as high as 40 %. The patient's questions have been answered.BEVERLY BOSIO is willing  to proceed with the planned procedure.  I  spent 40 minutes counseling the patient face to face and 50% or more the  time was spent in counseling and coordination of care. The total time spent in the appointment was 40 minutes.    [name], MD 10/18/2016 6:42 PM

## 2016-10-19 NOTE — Anesthesia Postprocedure Evaluation (Signed)
Anesthesia Post Note  Patient: Noah Cochran  Procedure(s) Performed: Procedure(s) (LRB): CORONARY ARTERY BYPASS GRAFTING (CABG) times five using left internal mammary artery and right saphenous vein.  Left mammary artery to Left anterior descending coronary artery, saphenous vein to diagonal, sequential vein to obtuse marginal and distal circumflex arteries, and vein graft to distal right coronary artery. (N/A) TRANSESOPHAGEAL ECHOCARDIOGRAM (TEE) (N/A)  Patient location during evaluation: ICU Anesthesia Type: General Level of consciousness: sedated and patient remains intubated per anesthesia plan Vital Signs Assessment: post-procedure vital signs reviewed and stable Respiratory status: patient on ventilator - see flowsheet for VS Cardiovascular status: stable Anesthetic complications: no       Last Vitals:  Vitals:   10/19/16 0500 10/19/16 0524  BP: (!) 98/59 (!) 144/86  Pulse: (!) 58 74  Resp:    Temp:      Last Pain:  Vitals:   10/19/16 0000  TempSrc: Oral                 Noah Cochran

## 2016-10-19 NOTE — Progress Notes (Signed)
Pink tape replaced with ETT holder without complication with assistance of assigned RN at bedside.

## 2016-10-19 NOTE — Progress Notes (Signed)
CRITICAL VALUE ALERT  Critical value received:  Hgb  Date of notification:  10/19/2016  Time of notification:  2015  Critical value read back: yes  Nurse who received alert:  Siri Cole, RN  MD notified (1st page):  MD Servando Snare  Time of first page:  2017  MD notified (2nd page):  Time of second page:  Responding MD:  MD Servando Snare  Time MD responded:  2020

## 2016-10-19 NOTE — Anesthesia Postprocedure Evaluation (Signed)
Anesthesia Post Note  Patient: Noah Cochran  Procedure(s) Performed: Procedure(s) (LRB): CORONARY ARTERY BYPASS GRAFTING (CABG) times five using left internal mammary artery and right saphenous vein.  Left mammary artery to Left anterior descending coronary artery, saphenous vein to diagonal, sequential vein to obtuse marginal and distal circumflex arteries, and vein graft to distal right coronary artery. (N/A) TRANSESOPHAGEAL ECHOCARDIOGRAM (TEE) (N/A)  Patient location during evaluation: SICU Anesthesia Type: General Level of consciousness: patient remains intubated per anesthesia plan Pain management: pain level controlled Vital Signs Assessment: post-procedure vital signs reviewed and stable Respiratory status: patient remains intubated per anesthesia plan Cardiovascular status: stable Anesthetic complications: no       Last Vitals:  Vitals:   10/19/16 0524 10/19/16 1446  BP: (!) 144/86   Pulse: 74 90  Resp:  12  Temp:      Last Pain:  Vitals:   10/19/16 1630  TempSrc: Core (Comment)                 Patrisia Faeth

## 2016-10-20 ENCOUNTER — Encounter (HOSPITAL_COMMUNITY): Payer: Self-pay | Admitting: Cardiology

## 2016-10-20 ENCOUNTER — Inpatient Hospital Stay (HOSPITAL_COMMUNITY): Payer: BLUE CROSS/BLUE SHIELD

## 2016-10-20 LAB — PREPARE PLATELET PHERESIS
Blood Product Expiration Date: 201801292359
ISSUE DATE / TIME: 201801271625
Unit Type and Rh: 6200

## 2016-10-20 LAB — GLUCOSE, CAPILLARY
GLUCOSE-CAPILLARY: 101 mg/dL — AB (ref 65–99)
GLUCOSE-CAPILLARY: 160 mg/dL — AB (ref 65–99)
GLUCOSE-CAPILLARY: 78 mg/dL (ref 65–99)
GLUCOSE-CAPILLARY: 98 mg/dL (ref 65–99)
Glucose-Capillary: 115 mg/dL — ABNORMAL HIGH (ref 65–99)
Glucose-Capillary: 95 mg/dL (ref 65–99)
Glucose-Capillary: 153 mg/dL — ABNORMAL HIGH (ref 65–99)

## 2016-10-20 LAB — TYPE AND SCREEN
ABO/RH(D): AB POS
Antibody Screen: NEGATIVE
Unit division: 0
Unit division: 0

## 2016-10-20 LAB — POCT I-STAT, CHEM 8
BUN: 13 mg/dL (ref 6–20)
CALCIUM ION: 1.09 mmol/L — AB (ref 1.15–1.40)
CHLORIDE: 103 mmol/L (ref 101–111)
CREATININE: 0.9 mg/dL (ref 0.61–1.24)
Glucose, Bld: 155 mg/dL — ABNORMAL HIGH (ref 65–99)
HEMATOCRIT: 24 % — AB (ref 39.0–52.0)
Hemoglobin: 8.2 g/dL — ABNORMAL LOW (ref 13.0–17.0)
Potassium: 3.7 mmol/L (ref 3.5–5.1)
SODIUM: 140 mmol/L (ref 135–145)
TCO2: 23 mmol/L (ref 0–100)

## 2016-10-20 LAB — CREATININE, SERUM
Creatinine, Ser: 1.07 mg/dL (ref 0.61–1.24)
GFR calc Af Amer: >60 mL/min (ref >60)
GFR calc non Af Amer: >60 mL/min (ref >60)

## 2016-10-20 LAB — CBC
HCT: 25.4 % — ABNORMAL LOW (ref 39.0–52.0)
HCT: 24.7 % — ABNORMAL LOW (ref 39.0–52.0)
HEMATOCRIT: 25.3 % — AB (ref 39.0–52.0)
Hemoglobin: 8.8 g/dL — ABNORMAL LOW (ref 13.0–17.0)
Hemoglobin: 8.9 g/dL — ABNORMAL LOW (ref 13.0–17.0)
Hemoglobin: 8.6 g/dL — ABNORMAL LOW (ref 13.0–17.0)
MCH: 30.2 pg (ref 26.0–34.0)
MCH: 30.3 pg (ref 26.0–34.0)
MCH: 30.6 pg (ref 26.0–34.0)
MCHC: 34.6 g/dL (ref 30.0–36.0)
MCHC: 34.8 g/dL (ref 30.0–36.0)
MCHC: 35.2 g/dL (ref 30.0–36.0)
MCV: 86.9 fL (ref 78.0–100.0)
MCV: 87 fL (ref 78.0–100.0)
MCV: 87.3 fL (ref 78.0–100.0)
PLATELETS: 78 10*3/uL — AB (ref 150–400)
PLATELETS: 100 10*3/uL — AB (ref 150–400)
Platelets: 75 10*3/uL — ABNORMAL LOW (ref 150–400)
RBC: 2.84 MIL/uL — AB (ref 4.22–5.81)
RBC: 2.91 MIL/uL — ABNORMAL LOW (ref 4.22–5.81)
RBC: 2.91 MIL/uL — ABNORMAL LOW (ref 4.22–5.81)
RDW: 13.6 % (ref 11.5–15.5)
RDW: 13.8 % (ref 11.5–15.5)
RDW: 14.5 % (ref 11.5–15.5)
WBC: 6 10*3/uL (ref 4.0–10.5)
WBC: 6.6 10*3/uL (ref 4.0–10.5)
WBC: 9.3 10*3/uL (ref 4.0–10.5)

## 2016-10-20 LAB — POCT I-STAT 3, ART BLOOD GAS (G3+)
ACID-BASE DEFICIT: 2 mmol/L (ref 0.0–2.0)
Acid-base deficit: 2 mmol/L (ref 0.0–2.0)
Bicarbonate: 22.6 mmol/L (ref 20.0–28.0)
Bicarbonate: 23.4 mmol/L (ref 20.0–28.0)
O2 Saturation: 98 %
O2 Saturation: 97 %
PCO2 ART: 41.3 mmHg (ref 32.0–48.0)
PH ART: 7.364 (ref 7.350–7.450)
Patient temperature: 37.5
TCO2: 24 mmol/L (ref 0–100)
TCO2: 25 mmol/L (ref 0–100)
pCO2 arterial: 36.6 mmHg (ref 32.0–48.0)
pH, Arterial: 7.403 (ref 7.350–7.450)
pO2, Arterial: 111 mmHg — ABNORMAL HIGH (ref 83.0–108.0)
pO2, Arterial: 97 mmHg (ref 83.0–108.0)

## 2016-10-20 LAB — BASIC METABOLIC PANEL
ANION GAP: 4 — AB (ref 5–15)
BUN: 8 mg/dL (ref 6–20)
CALCIUM: 7.4 mg/dL — AB (ref 8.9–10.3)
CO2: 24 mmol/L (ref 22–32)
Chloride: 111 mmol/L (ref 101–111)
Creatinine, Ser: 0.77 mg/dL (ref 0.61–1.24)
Glucose, Bld: 115 mg/dL — ABNORMAL HIGH (ref 65–99)
Potassium: 4.7 mmol/L (ref 3.5–5.1)
SODIUM: 139 mmol/L (ref 135–145)

## 2016-10-20 LAB — MAGNESIUM
MAGNESIUM: 2.3 mg/dL (ref 1.7–2.4)
Magnesium: 2.2 mg/dL (ref 1.7–2.4)

## 2016-10-20 LAB — HEMOGLOBIN A1C
Hgb A1c MFr Bld: 5.1 % (ref 4.8–5.6)
Mean Plasma Glucose: 100 mg/dL

## 2016-10-20 LAB — PREPARE FRESH FROZEN PLASMA
Blood Product Expiration Date: 201802012359
Blood Product Expiration Date: 201802012359
ISSUE DATE / TIME: 201801271624
ISSUE DATE / TIME: 201801271809
Unit Type and Rh: 2800
Unit Type and Rh: 8400

## 2016-10-20 LAB — PROTIME-INR
INR: 1.19
Prothrombin Time: 15.2 seconds (ref 11.4–15.2)

## 2016-10-20 LAB — APTT: aPTT: 36 seconds (ref 24–36)

## 2016-10-20 MED ORDER — INSULIN ASPART 100 UNIT/ML ~~LOC~~ SOLN
0.0000 [IU] | SUBCUTANEOUS | Status: DC
  Administered 2016-10-20 – 2016-10-21 (×2): 2 [IU] via SUBCUTANEOUS

## 2016-10-20 MED ORDER — ASPIRIN EC 81 MG PO TBEC
81.0000 mg | DELAYED_RELEASE_TABLET | Freq: Every day | ORAL | Status: DC
  Administered 2016-10-20 – 2016-10-24 (×5): 81 mg via ORAL
  Filled 2016-10-20 (×5): qty 1

## 2016-10-20 MED ORDER — SODIUM CHLORIDE 0.9 % IV SOLN
30.0000 meq | Freq: Once | INTRAVENOUS | Status: AC
  Administered 2016-10-20: 30 meq via INTRAVENOUS
  Filled 2016-10-20: qty 15

## 2016-10-20 MED ORDER — ASPIRIN 81 MG PO CHEW
81.0000 mg | CHEWABLE_TABLET | Freq: Every day | ORAL | Status: DC
  Filled 2016-10-20: qty 1

## 2016-10-20 MED ORDER — DOPAMINE-DEXTROSE 3.2-5 MG/ML-% IV SOLN: 0.0000 ug/kg/min | INTRAVENOUS | Status: AC

## 2016-10-20 NOTE — Progress Notes (Signed)
Ref: Kathlene November, MD   Subjective:  Off NTG and most pressure agents. Awake. Feels better. Chest tube drainage has slowed. Got 2 units PRBC transfused yesterday. Monitor - atrial paced 85 to 110.  Objective:  Vital Signs in the last 24 hours: Temp:  [95.9 F (35.5 C)-100 F (37.8 C)] 98 F (36.7 C) (01/28 1200) Pulse Rate:  [85-110] 104 (01/28 1430) Cardiac Rhythm: Atrial paced (01/28 1400) Resp:  [0-25] 20 (01/28 1414) BP: (81-126)/(43-84) 95/69 (01/28 1430) SpO2:  [82 %-100 %] 98 % (01/28 1430) Arterial Line BP: (88-144)/(46-73) 110/58 (01/28 0830) FiO2 (%):  [40 %-50 %] 40 % (01/28 0328) Weight:  [59.7 kg (131 lb 9.8 oz)] 59.7 kg (131 lb 9.8 oz) (01/28 0412)  Physical Exam: BP Readings from Last 1 Encounters:  10/20/16 95/69    Wt Readings from Last 1 Encounters:  10/20/16 59.7 kg (131 lb 9.8 oz)    Weight change: 7.5 kg (16 lb 8.6 oz) Body mass index is 21.24 kg/m. HEENT: [place]/AT, Eyes-PERL, EOMI, Conjunctiva-Pink, Sclera-Non-icteric Neck: No JVD, No bruit, Trachea midline. Lungs:  Clear, Bilateral. Midline dressing Cardiac:  Regular paced rhythm, normal S1 and S2, no S3. II/VI systolic murmur. Abdomen:  Soft, non-tender. BS present. Extremities:  No edema present. No cyanosis. No clubbing. CNS: AxOx3, Cranial nerves grossly intact, moves all 4 extremities.  Skin: Warm and dry.   Intake/Output from previous day: 01/27 0701 - 01/28 0700 In: 7982.5 [I.V.:4569.9; Blood:1852.7; NG/GT:60; IV Piggyback:1500] Out: 6760 [Urine:4865; Blood:1000; Chest Tube:895]    Lab Results: BMET    Component Value Date/Time   NA 139 10/20/2016 0330   NA 143 10/19/2016 1437   NA 140 10/19/2016 1308   K 4.7 10/20/2016 0330   K 3.8 10/19/2016 1437   K 3.6 10/19/2016 1308   CL 111 10/20/2016 0330   CL 105 10/19/2016 1308   CL 104 10/19/2016 1144   CO2 24 10/20/2016 0330   CO2 24 10/19/2016 0304   CO2 21 (L) 10/18/2016 1224   GLUCOSE 115 (H) 10/20/2016 0330   GLUCOSE 95 10/19/2016  1437   GLUCOSE 133 (H) 10/19/2016 1308   BUN 8 10/20/2016 0330   BUN 5 (L) 10/19/2016 1308   BUN 6 10/19/2016 1144   CREATININE 0.77 10/20/2016 0330   CREATININE 0.74 10/19/2016 1946   CREATININE 0.40 (L) 10/19/2016 1308   CALCIUM 7.4 (L) 10/20/2016 0330   CALCIUM 7.8 (L) 10/19/2016 0304   CALCIUM 8.4 (L) 10/18/2016 1224   GFRNONAA >60 10/20/2016 0330   GFRNONAA >60 10/19/2016 1946   GFRNONAA >60 10/19/2016 0304   GFRAA >60 10/20/2016 0330   GFRAA >60 10/19/2016 1946   GFRAA >60 10/19/2016 0304   CBC    Component Value Date/Time   WBC 6.6 10/20/2016 0330   RBC 2.91 (L) 10/20/2016 0330   HGB 8.9 (L) 10/20/2016 0330   HCT 25.3 (L) 10/20/2016 0330   PLT 78 (L) 10/20/2016 0330   MCV 86.9 10/20/2016 0330   MCH 30.6 10/20/2016 0330   MCHC 35.2 10/20/2016 0330   RDW 13.8 10/20/2016 0330   LYMPHSABS 1.1 10/18/2016 1224   MONOABS 0.6 10/18/2016 1224   EOSABS 0.1 10/18/2016 1224   BASOSABS 0.0 10/18/2016 1224   HEPATIC Function Panel  Recent Labs  03/05/16 0941 10/18/16 0948 10/18/16 1224  PROT 7.6 5.4* 5.8*   HEMOGLOBIN A1C No components found for: HGA1C,  MPG CARDIAC ENZYMES Lab Results  Component Value Date   TROPONINI 0.24 (HH) 10/18/2016   TROPONINI 0.28 (  HH) 10/18/2016   TROPONINI 0.24 (HH) 10/18/2016   BNP No results for input(s): PROBNP in the last 8760 hours. TSH No results for input(s): TSH in the last 8760 hours. CHOLESTEROL  Recent Labs  03/05/16 0941 10/18/16 0948 10/19/16 0304  CHOL 217* 188 190    Scheduled Meds: . acetaminophen  1,000 mg Oral Q6H   Or  . acetaminophen (TYLENOL) oral liquid 160 mg/5 mL  1,000 mg Per Tube Q6H  . aspirin EC  81 mg Oral Daily   Or  . aspirin  81 mg Per Tube Daily  . atorvastatin  80 mg Oral q1800  . bisacodyl  10 mg Oral Daily   Or  . bisacodyl  10 mg Rectal Daily  . cefUROXime (ZINACEF)  IV  1.5 g Intravenous Q12H  . Chlorhexidine Gluconate Cloth  6 each Topical Daily  . docusate sodium  200 mg Oral  Daily  . insulin aspart  0-24 Units Subcutaneous Q4H  . levalbuterol  0.63 mg Nebulization TID  . metoprolol tartrate  12.5 mg Oral BID   Or  . metoprolol tartrate  12.5 mg Per Tube BID  . mupirocin ointment  1 application Nasal BID  . [START ON 10/21/2016] pantoprazole  40 mg Oral Daily  . sodium chloride flush  3 mL Intravenous Q12H   Continuous Infusions: . sodium chloride Stopped (10/20/16 0900)  . sodium chloride Stopped (10/20/16 0600)  . sodium chloride Stopped (10/20/16 0900)  . dexmedetomidine Stopped (10/20/16 0400)  . lactated ringers 20 mL/hr at 10/20/16 1400  . nitroGLYCERIN Stopped (10/19/16 1430)  . phenylephrine (NEO-SYNEPHRINE) Adult infusion 5.067 mcg/min (10/20/16 1400)   PRN Meds:.sodium chloride, lactated ringers, metoprolol, midazolam, morphine injection, ondansetron (ZOFRAN) IV, oxyCODONE, sodium chloride flush, traMADol  Assessment/Plan: Post op day 1 5 vessel CBAG Acute coronary syndrome Multivessel CAD Tobacco use disorder H/O CA prostate  Continue medical treatment and follow with surgery.    LOS: 2 days    Dixie Dials  MD  10/20/2016, 2:45 PM

## 2016-10-20 NOTE — Progress Notes (Signed)
Patient ID: Noah Cochran, male   DOB: September 25, 1950, 66 y.o.   MRN: [id number] TCTS DAILY ICU PROGRESS NOTE                   [address]            [place] [address]          [phone]   1 Day Post-Op Procedure(s) (LRB): CORONARY ARTERY BYPASS GRAFTING (CABG) times five using left internal mammary artery and right saphenous vein.  Left mammary artery to Left anterior descending coronary artery, saphenous vein to diagonal, sequential vein to obtuse marginal and distal circumflex arteries, and vein graft to distal right coronary artery. (N/A) TRANSESOPHAGEAL ECHOCARDIOGRAM (TEE) (N/A)  Total Length of Stay:  LOS: 2 days   Subjective: Patient made awake and alert and neurologically intact.  Objective: Vital signs in last 24 hours: Temp:  [95.9 F (35.5 C)-100 F (37.8 C)] 98.8 F (37.1 C) (01/28 0830) Pulse Rate:  [85-110] 90 (01/28 0830) Cardiac Rhythm: Atrial paced (01/28 0800) Resp:  [0-25] 20 (01/28 0830) BP: (81-117)/(43-77) 101/67 (01/28 0800) SpO2:  [82 %-100 %] 99 % (01/28 0830) Arterial Line BP: (88-144)/(46-73) 110/58 (01/28 0830) FiO2 (%):  [40 %-50 %] 40 % (01/28 0328) Weight:  [131 lb 9.8 oz (59.7 kg)] 131 lb 9.8 oz (59.7 kg) (01/28 0412)  Filed Weights   10/18/16 1159 10/19/16 0545 10/20/16 0412  Weight: 115 lb 1.3 oz (52.2 kg) 122 lb 8 oz (55.6 kg) 131 lb 9.8 oz (59.7 kg)    Weight change: 16 lb 8.6 oz (7.5 kg)   Hemodynamic parameters for last 24 hours: PAP: (19-35)/(8-20) 20/11 CO:  [3.7 L/min-5.8 L/min] 4.9 L/min CI:  [2.3 L/min/m2-3.6 L/min/m2] 3 L/min/m2  Intake/Output from previous day: 01/27 0701 - 01/28 0700 In: 7982.5 [I.V.:4569.9; Blood:1852.7; NG/GT:60; IV Piggyback:1500] Out: 6760 [Urine:4865; Blood:1000; Chest Tube:895]  Intake/Output this shift: Total I/O In: 69.4 [I.V.:69.4] Out: 55 [Urine:35; Chest Tube:20]  Current Meds: Scheduled Meds: . acetaminophen  1,000 mg Oral Q6H   Or  . acetaminophen (TYLENOL) oral  liquid 160 mg/5 mL  1,000 mg Per Tube Q6H  . aspirin EC  325 mg Oral Daily   Or  . aspirin  324 mg Per Tube Daily  . atorvastatin  80 mg Oral q1800  . bisacodyl  10 mg Oral Daily   Or  . bisacodyl  10 mg Rectal Daily  . cefUROXime (ZINACEF)  IV  1.5 g Intravenous Q12H  . Chlorhexidine Gluconate Cloth  6 each Topical Daily  . docusate sodium  200 mg Oral Daily  . insulin aspart  0-24 Units Subcutaneous Q4H  . levalbuterol  0.63 mg Nebulization TID  . metoprolol tartrate  12.5 mg Oral BID   Or  . metoprolol tartrate  12.5 mg Per Tube BID  . mupirocin ointment  1 application Nasal BID  . [START ON 10/21/2016] pantoprazole  40 mg Oral Daily  . sodium chloride flush  3 mL Intravenous Q12H   Continuous Infusions: . sodium chloride 20 mL/hr at 10/20/16 0800  . sodium chloride Stopped (10/20/16 0600)  . sodium chloride 20 mL/hr at 10/20/16 0800  . dexmedetomidine Stopped (10/20/16 0400)  . DOPamine 2.014 mcg/kg/min (10/20/16 0800)  . lactated ringers 20 mL/hr at 10/20/16 0800  . nitroGLYCERIN Stopped (10/19/16 1430)  . phenylephrine (NEO-SYNEPHRINE) Adult infusion 10 mcg/min (10/20/16 0815)   PRN Meds:.sodium chloride, albumin human, lactated ringers, metoprolol, midazolam, morphine injection, ondansetron (ZOFRAN) IV, oxyCODONE,  sodium chloride flush, traMADol  General appearance: alert and cooperative Neurologic: intact Heart: regular rate and rhythm, S1, S2 normal, no murmur, click, rub or gallop Lungs: diminished breath sounds bibasilar Abdomen: soft, non-tender; bowel sounds normal; no masses,  no organomegaly Extremities: extremities normal, atraumatic, no cyanosis or edema and Homans sign is negative, no sign of DVT Wound: Sternum intact  Lab Results: CBC: Recent Labs  10/20/16 0044 10/20/16 0330  WBC 6.0 6.6  HGB 8.8* 8.9*  HCT 25.4* 25.3*  PLT 75* 78*   BMET:  Recent Labs  10/19/16 0304  10/19/16 1308 10/19/16 1437 10/19/16 1946 10/20/16 0330  NA 138  < > 140  143  --  139  K 3.7  < > 3.6 3.8  --  4.7  CL 111  < > 105  --   --  111  CO2 24  --   --   --   --  24  GLUCOSE 86  < > 133* 95  --  115*  BUN 9  < > 5*  --   --  8  CREATININE 0.83  < > 0.40*  --  0.74 0.77  CALCIUM 7.8*  --   --   --   --  7.4*  < > = values in this interval not displayed.  CMET: Lab Results  Component Value Date   WBC 6.6 10/20/2016   HGB 8.9 (L) 10/20/2016   HCT 25.3 (L) 10/20/2016   PLT 78 (L) 10/20/2016   GLUCOSE 115 (H) 10/20/2016   CHOL 190 10/19/2016   TRIG 76 10/19/2016   HDL 60 10/19/2016   LDLCALC 115 (H) 10/19/2016   ALT 17 10/18/2016   AST 27 10/18/2016   NA 139 10/20/2016   K 4.7 10/20/2016   CL 111 10/20/2016   CREATININE 0.77 10/20/2016   BUN 8 10/20/2016   CO2 24 10/20/2016   TSH 1.07 03/07/2015   PSA 5.87 (H) 12/04/2015   INR 1.19 10/20/2016   HGBA1C 5.2 10/18/2016      PT/INR:  Recent Labs  10/20/16 0044  LABPROT 15.2  INR 1.19   Radiology: Dg Chest Port 1 View  Result Date: 10/20/2016 CLINICAL DATA:  Status post CABG. EXAM: PORTABLE CHEST 1 VIEW COMPARISON:  10/19/2016 FINDINGS: Status post extubation and removal of enteric catheter. Swan-Ganz catheter and thoracic/mediastinal drain remain in place. Cardiomediastinal silhouette is normal. Mediastinal contours appear intact. There is no evidence of pneumothorax. There may be small bilateral pleural effusions with bibasilar atelectasis, left greater than right. Osseous structures are without acute abnormality. Soft tissues are grossly normal. IMPRESSION: Status post extubation. Possible small bilateral pleural effusions with bibasilar atelectasis. Electronically Signed   By: Fidela Salisbury M.D.   On: 10/20/2016 07:53   Dg Chest Port 1 View  Result Date: 10/19/2016 CLINICAL DATA:  Status post CABG. EXAM: PORTABLE CHEST 1 VIEW COMPARISON:  10/18/2006 pain FINDINGS: Median sternotomy and CABG changes noted. The cardiomediastinal silhouette is unchanged. An endotracheal tube with  tip 0.9 cm above the carina, NG tube entering the stomach, right IJ Swan-Ganz catheter with tip overlying the main/right pulmonary artery, and mediastinal and left thoracostomy tubes noted. There is no evidence of pneumothorax or pulmonary edema. No pleural effusion identified. IMPRESSION: Status post CABG with changes and support apparatus as described. No evidence of pneumothorax. Electronically Signed   By: Margarette Canada M.D.   On: 10/19/2016 14:54     Assessment/Plan: S/P Procedure(s) (LRB): CORONARY ARTERY BYPASS GRAFTING (CABG) times five  using left internal mammary artery and right saphenous vein.  Left mammary artery to Left anterior descending coronary artery, saphenous vein to diagonal, sequential vein to obtuse marginal and distal circumflex arteries, and vein graft to distal right coronary artery. (N/A) TRANSESOPHAGEAL ECHOCARDIOGRAM (TEE) (N/A) Mobilize Diuresis d/c tubes/lines Continue foley due to strict I&O, patient in ICU and urinary output monitoring See progression orders Postop coagulopathy has improved, with administration of platelets and FFP and DDAVP.  Expected Acute  Blood - loss Anemia Plan removal femoral A-line and mobilize patient Still with thrombocytopenia- will avoid heparin currently use PAS hose for DVT prophylaxis   [name] 10/20/2016 8:40 AM

## 2016-10-20 NOTE — Progress Notes (Signed)
Patient ID: Noah Cochran, male   DOB: 08-09-1951, 66 y.o.   MRN: [id number] EVENING ROUNDS NOTE :     [address]       [place] [address]             [phone]                 1 Day Post-Op Procedure(s) (LRB): CORONARY ARTERY BYPASS GRAFTING (CABG) times five using left internal mammary artery and right saphenous vein.  Left mammary artery to Left anterior descending coronary artery, saphenous vein to diagonal, sequential vein to obtuse marginal and distal circumflex arteries, and vein graft to distal right coronary artery. (N/A) TRANSESOPHAGEAL ECHOCARDIOGRAM (TEE) (N/A)  Total Length of Stay:  LOS: 2 days  BP 114/71 (BP Location: Right Arm)   Pulse 94   Temp 98 F (36.7 C) (Oral)   Resp (!) 22   Ht 5\' 6"  (1.676 m)   Wt 131 lb 9.8 oz (59.7 kg)   SpO2 100%   BMI 21.24 kg/m   .Intake/Output      01/27 0701 - 01/28 0700 01/28 0701 - 01/29 0700   I.V. (mL/kg) 4569.9 (76.5) 308.6 (5.2)   Blood 1852.7    NG/GT 60    IV Piggyback 1500 50   Total Intake(mL/kg) 7982.5 (133.7) 358.6 (6)   Urine (mL/kg/hr) 4865 (3.4) 340 (0.6)   Blood 1000 (0.7)    Chest Tube 895 (0.6) 610 (1.1)   Total Output 6760 950   Net +1222.5 -591.4          . sodium chloride Stopped (10/20/16 0900)  . sodium chloride Stopped (10/20/16 0600)  . sodium chloride Stopped (10/20/16 0900)  . dexmedetomidine Stopped (10/20/16 0400)  . lactated ringers 20 mL/hr at 10/20/16 1600  . nitroGLYCERIN Stopped (10/19/16 1430)  . phenylephrine (NEO-SYNEPHRINE) Adult infusion 10 mcg/min (10/20/16 1600)     Lab Results  Component Value Date   WBC 6.6 10/20/2016   HGB 8.2 (L) 10/20/2016   HCT 24.0 (L) 10/20/2016   PLT 78 (L) 10/20/2016   GLUCOSE 155 (H) 10/20/2016   CHOL 190 10/19/2016   TRIG 76 10/19/2016   HDL 60 10/19/2016   LDLCALC 115 (H) 10/19/2016   ALT 17 10/18/2016   AST 27 10/18/2016   NA 140 10/20/2016   K 3.7 10/20/2016   CL 103 10/20/2016   CREATININE 0.90 10/20/2016   BUN 13 10/20/2016   CO2 24 10/20/2016   TSH 1.07 03/07/2015   PSA 5.87 (H) 12/04/2015   INR 1.19 10/20/2016   HGBA1C 5.1 10/18/2016   Stable day  [name] MD  Beeper [phone] Office [phone] 10/20/2016 4:42 PM

## 2016-10-20 NOTE — Progress Notes (Signed)
Rapid Wean Protocol begun, patient is tolerating well. RT and RN at bedside.

## 2016-10-20 NOTE — Progress Notes (Signed)
Ref: Kathlene November, MD   Subjective:  Stable post surgery for multivessel CAD.   Objective:  Vital Signs in the last 24 hours: Temp:  [95.9 F (35.5 C)-100 F (37.8 C)] 98 F (36.7 C) (01/28 1200) Pulse Rate:  [85-110] 95 (01/28 1414) Cardiac Rhythm: Atrial paced (01/28 1400) Resp:  [0-25] 20 (01/28 1414) BP: (81-126)/(43-84) 103/66 (01/28 1400) SpO2:  [82 %-100 %] 100 % (01/28 1414) Arterial Line BP: (88-144)/(46-73) 110/58 (01/28 0830) FiO2 (%):  [40 %-50 %] 40 % (01/28 0328) Weight:  [59.7 kg (131 lb 9.8 oz)] 59.7 kg (131 lb 9.8 oz) (01/28 0412)  Physical Exam: BP Readings from Last 1 Encounters:  10/20/16 103/66    Wt Readings from Last 1 Encounters:  10/20/16 59.7 kg (131 lb 9.8 oz)    Weight change: 7.5 kg (16 lb 8.6 oz) Body mass index is 21.24 kg/m. HEENT: [place]/AT, Eyes-PERL, EOMI, Conjunctiva-Pink, Sclera-Non-icteric Neck: No JVD, No bruit, Trachea midline. Lungs:  Clearing, Bilateral. Midline dressing. Cardiac:  Regular paced rhythm, normal S1 and S2, no S3. II/VI systolic murmur. Abdomen:  Soft, non-tender. BS present. Extremities:  No edema present. No cyanosis. No clubbing. CNS: AxOx3, Cranial nerves grossly intact, moves all 4 extremities.  Skin: Warm and dry.   Intake/Output from previous day: 01/27 0701 - 01/28 0700 In: 7982.5 [I.V.:4569.9; Blood:1852.7; NG/GT:60; IV Piggyback:1500] Out: 6760 [Urine:4865; Blood:1000; Chest Tube:895]    Lab Results: BMET    Component Value Date/Time   NA 139 10/20/2016 0330   NA 143 10/19/2016 1437   NA 140 10/19/2016 1308   K 4.7 10/20/2016 0330   K 3.8 10/19/2016 1437   K 3.6 10/19/2016 1308   CL 111 10/20/2016 0330   CL 105 10/19/2016 1308   CL 104 10/19/2016 1144   CO2 24 10/20/2016 0330   CO2 24 10/19/2016 0304   CO2 21 (L) 10/18/2016 1224   GLUCOSE 115 (H) 10/20/2016 0330   GLUCOSE 95 10/19/2016 1437   GLUCOSE 133 (H) 10/19/2016 1308   BUN 8 10/20/2016 0330   BUN 5 (L) 10/19/2016 1308   BUN 6 10/19/2016  1144   CREATININE 0.77 10/20/2016 0330   CREATININE 0.74 10/19/2016 1946   CREATININE 0.40 (L) 10/19/2016 1308   CALCIUM 7.4 (L) 10/20/2016 0330   CALCIUM 7.8 (L) 10/19/2016 0304   CALCIUM 8.4 (L) 10/18/2016 1224   GFRNONAA >60 10/20/2016 0330   GFRNONAA >60 10/19/2016 1946   GFRNONAA >60 10/19/2016 0304   GFRAA >60 10/20/2016 0330   GFRAA >60 10/19/2016 1946   GFRAA >60 10/19/2016 0304   CBC    Component Value Date/Time   WBC 6.6 10/20/2016 0330   RBC 2.91 (L) 10/20/2016 0330   HGB 8.9 (L) 10/20/2016 0330   HCT 25.3 (L) 10/20/2016 0330   PLT 78 (L) 10/20/2016 0330   MCV 86.9 10/20/2016 0330   MCH 30.6 10/20/2016 0330   MCHC 35.2 10/20/2016 0330   RDW 13.8 10/20/2016 0330   LYMPHSABS 1.1 10/18/2016 1224   MONOABS 0.6 10/18/2016 1224   EOSABS 0.1 10/18/2016 1224   BASOSABS 0.0 10/18/2016 1224   HEPATIC Function Panel  Recent Labs  03/05/16 0941 10/18/16 0948 10/18/16 1224  PROT 7.6 5.4* 5.8*   HEMOGLOBIN A1C No components found for: HGA1C,  MPG CARDIAC ENZYMES Lab Results  Component Value Date   TROPONINI 0.24 (HH) 10/18/2016   TROPONINI 0.28 (HH) 10/18/2016   TROPONINI 0.24 ([place]) 10/18/2016   BNP No results for input(s): PROBNP in the last 8760  hours. TSH No results for input(s): TSH in the last 8760 hours. CHOLESTEROL  Recent Labs  03/05/16 0941 10/18/16 0948 10/19/16 0304  CHOL 217* 188 190    Scheduled Meds: . acetaminophen  1,000 mg Oral Q6H   Or  . acetaminophen (TYLENOL) oral liquid 160 mg/5 mL  1,000 mg Per Tube Q6H  . aspirin EC  81 mg Oral Daily   Or  . aspirin  81 mg Per Tube Daily  . atorvastatin  80 mg Oral q1800  . bisacodyl  10 mg Oral Daily   Or  . bisacodyl  10 mg Rectal Daily  . cefUROXime (ZINACEF)  IV  1.5 g Intravenous Q12H  . Chlorhexidine Gluconate Cloth  6 each Topical Daily  . docusate sodium  200 mg Oral Daily  . insulin aspart  0-24 Units Subcutaneous Q4H  . levalbuterol  0.63 mg Nebulization TID  . metoprolol  tartrate  12.5 mg Oral BID   Or  . metoprolol tartrate  12.5 mg Per Tube BID  . mupirocin ointment  1 application Nasal BID  . [START ON 10/21/2016] pantoprazole  40 mg Oral Daily  . sodium chloride flush  3 mL Intravenous Q12H   Continuous Infusions: . sodium chloride Stopped (10/20/16 0900)  . sodium chloride Stopped (10/20/16 0600)  . sodium chloride Stopped (10/20/16 0900)  . dexmedetomidine Stopped (10/20/16 0400)  . lactated ringers 20 mL/hr at 10/20/16 1400  . nitroGLYCERIN Stopped (10/19/16 1430)  . phenylephrine (NEO-SYNEPHRINE) Adult infusion 5.067 mcg/min (10/20/16 1400)   PRN Meds:.sodium chloride, lactated ringers, metoprolol, midazolam, morphine injection, ondansetron (ZOFRAN) IV, oxyCODONE, sodium chloride flush, traMADol  Assessment/Plan: Post op 5 vessel CABG Acute coronary syndrome Multivessel CAD Tobacco use disorder H/O CA prostate  Follow with surgery.   LOS: 1 day    Dixie Dials  MD  10/20/2016, 2:41 PM

## 2016-10-20 NOTE — Procedures (Signed)
Extubation Procedure Note  Patient Details:   Name: Noah Cochran DOB: 1951/08/27 MRN: [id number]   Airway Documentation:     Evaluation  O2 sats: stable throughout Complications: No apparent complications Patient did tolerate procedure well. Bilateral Breath Sounds: Clear, Diminished   Yes  Jori Moll 10/20/2016, 4:02 AM   Patient performed a NIF of greater than -60, and FVC of 2L and cuff leak was present prior to extubation, patient performed ability to speak and a best of 543ml on the incentive spirometer. RT will continue to monitor as needed.

## 2016-10-21 ENCOUNTER — Inpatient Hospital Stay (HOSPITAL_COMMUNITY): Payer: BLUE CROSS/BLUE SHIELD

## 2016-10-21 ENCOUNTER — Encounter (HOSPITAL_COMMUNITY): Payer: Self-pay | Admitting: Cardiothoracic Surgery

## 2016-10-21 LAB — GLUCOSE, CAPILLARY
GLUCOSE-CAPILLARY: 94 mg/dL (ref 65–99)
Glucose-Capillary: 100 mg/dL — ABNORMAL HIGH (ref 65–99)
Glucose-Capillary: 108 mg/dL — ABNORMAL HIGH (ref 65–99)
Glucose-Capillary: 130 mg/dL — ABNORMAL HIGH (ref 65–99)
Glucose-Capillary: 97 mg/dL (ref 65–99)
Glucose-Capillary: 99 mg/dL (ref 65–99)

## 2016-10-21 LAB — BASIC METABOLIC PANEL
Anion gap: 4 — ABNORMAL LOW (ref 5–15)
BUN: 12 mg/dL (ref 6–20)
CO2: 27 mmol/L (ref 22–32)
Calcium: 7.6 mg/dL — ABNORMAL LOW (ref 8.9–10.3)
Chloride: 107 mmol/L (ref 101–111)
Creatinine, Ser: 0.85 mg/dL (ref 0.61–1.24)
GFR calc Af Amer: >60 mL/min (ref >60)
GFR calc non Af Amer: >60 mL/min (ref >60)
Glucose, Bld: 101 mg/dL — ABNORMAL HIGH (ref 65–99)
Potassium: 3.8 mmol/L (ref 3.5–5.1)
Sodium: 138 mmol/L (ref 135–145)

## 2016-10-21 LAB — CBC
HCT: 23.3 % — ABNORMAL LOW (ref 39.0–52.0)
Hemoglobin: 8.1 g/dL — ABNORMAL LOW (ref 13.0–17.0)
MCH: 30.5 pg (ref 26.0–34.0)
MCHC: 34.8 g/dL (ref 30.0–36.0)
MCV: 87.6 fL (ref 78.0–100.0)
Platelets: 92 10*3/uL — ABNORMAL LOW (ref 150–400)
RBC: 2.66 MIL/uL — ABNORMAL LOW (ref 4.22–5.81)
RDW: 15 % (ref 11.5–15.5)
WBC: 9.2 10*3/uL (ref 4.0–10.5)

## 2016-10-21 MED ORDER — CHLORHEXIDINE GLUCONATE 0.12 % MT SOLN
15.0000 mL | Freq: Two times a day (BID) | OROMUCOSAL | Status: DC
  Administered 2016-10-21 – 2016-10-22 (×2): 15 mL via OROMUCOSAL
  Filled 2016-10-21: qty 15

## 2016-10-21 MED ORDER — PHENOL 1.4 % MT LIQD
1.0000 | OROMUCOSAL | Status: DC | PRN
  Administered 2016-10-21 – 2016-10-22 (×3): 1 via OROMUCOSAL
  Filled 2016-10-21: qty 177

## 2016-10-21 MED ORDER — ORAL CARE MOUTH RINSE
15.0000 mL | Freq: Two times a day (BID) | OROMUCOSAL | Status: DC
  Administered 2016-10-22: 15 mL via OROMUCOSAL

## 2016-10-21 MED ORDER — FOLIC ACID 1 MG PO TABS
1.0000 mg | ORAL_TABLET | Freq: Every day | ORAL | Status: DC
  Administered 2016-10-21 – 2016-10-24 (×4): 1 mg via ORAL
  Filled 2016-10-21 (×5): qty 1

## 2016-10-21 MED FILL — Heparin Sodium (Porcine) Inj 1000 Unit/ML: INTRAMUSCULAR | Qty: 30 | Status: AC

## 2016-10-21 MED FILL — Magnesium Sulfate Inj 50%: INTRAMUSCULAR | Qty: 10 | Status: AC

## 2016-10-21 MED FILL — Potassium Chloride Inj 2 mEq/ML: INTRAVENOUS | Qty: 40 | Status: AC

## 2016-10-21 NOTE — Op Note (Signed)
NAME:  Noah Cochran, Noah Cochran NO.:  [id number]  MEDICAL RECORD NO.:  [id number]  LOCATION:  [facility]                         FACILITY:  [facility]  PHYSICIAN:  Lanelle Bal, MD    DATE OF BIRTH:  Jan 15, 1951  DATE OF PROCEDURE:  10/19/2016 DATE OF DISCHARGE:                              OPERATIVE REPORT   PREOPERATIVE DIAGNOSIS:  Coronary occlusive disease presenting with ST- elevation myocardial infarction.  POSTOPERATIVE DIAGNOSIS:  Coronary occlusive disease presenting with ST- elevation myocardial infarction.  SURGICAL PROCEDURE:  Emergent coronary artery bypass grafting x5 with the left internal mammary to the left anterior descending coronary artery, reverse saphenous vein graft to the diagonal coronary artery, sequential reverse saphenous vein graft to the first obtuse marginal and distal circumflex, reverse saphenous vein graft to the distal right coronary artery with right thigh and calf, greater saphenous vein endoscopic harvesting, placement of femoral arterial, ultrasound-guided.  SURGEON:  Lanelle Bal, MD  FIRST ASSISTANT:  John Giovanni, PA-C  BRIEF HISTORY:  The patient is a 66 year old male, who presented the afternoon prior to surgery with new onset of unstable anginal symptoms. He was initially seen by Dr. Terrence Dupont as an acute STEMI and underwent urgent cardiac catheterization.  He was found to have preserved LV function and severe three-vessel coronary artery disease including total occlusion of the right coronary artery, high-grade stenosis of the LAD, and diffuse disease throughout the circumflex system both proximally, but also there was significant distal disease.  With medical treatment, the patient's symptoms calmed, became stable, he was seen by Thoracic Surgery and with his symptoms and severe three-vessel disease, felt appropriate for bypass surgery.  He was on heparin and Integrilin and underwent emergent coronary artery bypass grafting,  Saturday morning, October 19, 2016.  Risks and options had been discussed with the patient in detail.  He had no previous known coronary disease history.  He is a long-term smoker and has a strong family history.  DESCRIPTION OF PROCEDURE:  With Swan-Ganz and arterial line monitors in place, the patient underwent general endotracheal anesthesia without incident.  Skin of the chest and legs were prepped with Betadine and draped in usual sterile manner.  Appropriate time-out was performed and then we proceeded with endoscopic vein harvesting of the right greater saphenous vein from the right thigh and calf.  The vein was of good quality and caliber.  Median sternotomy was performed.  Left internal mammary artery was dissected down as a pedicle graft.  The distal artery was divided and had good free flow.  Pericardium was opened.  Overall, ventricular function appeared preserved.  The patient was systemically heparinized.  Ascending aorta was cannulated.  The right atrium was cannulated.  An aortic root vent cardioplegia needle was introduced into the ascending aorta.  The patient was placed on cardiopulmonary bypass 2.4 L/min/m2.  Sites of anastomosis were selected and dissected out of the epicardium.  The patient's body temperature was cooled to 32 degrees.  Aortic crossclamp was applied and 600 mL of cold blood potassium cardioplegia was administered antegrade.  We turned our attention first to the distal right coronary artery, which was opened and admitted a 1.5 mm probe.  Just  prior to the takeoff of the posterior descending, the vessel had been opened and distal anastomosis was performed with a running 7-0 Prolene in a segment of reverse saphenous vein graft.  The heart was then elevated and the first obtuse marginal, which had diffuse disease in the distal branch, but was of suitable size in the midportion where it was relatively free of calcium, the vessel was opened and a  side-to-side anastomosis was performed with a running 7- 0 Prolene with a segment of reverse saphenous vein graft.  The distal extent was then carried to the distal circumflex.  The distal circumflex was opened and was somewhat better quality vessel than the first OM, one probe passed distally and proximally easily.  Vessel was 1.3 mm in size. Using a running 7-0 Prolene, a distal anastomosis was performed. Additional cold blood cardioplegia was given intermittently, refused down the vein grafts.  Attention was then turned to the first diagonal. This was a relatively small vessel, 1.2 mm in size.  Using a running 8-0 Prolene, a distal anastomosis was performed with a segment of reverse saphenous vein graft.  In the mid portion of the LAD, there was a high- grade stenosis at the time of catheterization, this area was obvious at the time of examination of LAD and distal to this, the LAD was a suitable vessel for bypass.  The vessel was opened, admitted a 1.5 mm probe distally.  Using a running 8-0 Prolene, left internal mammary artery was anastomosed to the left anterior descending coronary artery. With the cross-clamp still in place, three punch aortotomies were performed and each of the 3 vein grafts were anastomosed to the ascending aorta.  Air was evacuated from the grafts and the heart was allowed to passively fill and de-air.  The proximal anastomoses were completed, the bulldog was removed from the mammary artery with prompt rise in myocardial septal temperature.  Aortic crossclamp was removed. Total cross-clamp time of 109 minutes.  Sites of anastomoses were inspected and were free of bleeding.  The patient's body temperature rewarmed to 37 degrees.  Atrial and ventricular pacing wires were applied.  Graft marker was applied.  We started weaning the patient from cardiopulmonary bypass, however at this point, the arterial line was not functioning well, so we used a SonoSite and a  direct stick of the right femoral artery and a small arterial line catheter was placed into the right femoral artery to monitor blood pressure.  With this, we then weaned the patient from cardiopulmonary bypass without difficulty.  He remained hemodynamically stable.  Decannulated in usual fashion. Protamine sulfate was administered.  Atrial and ventricular pacing wires were applied.  Graft markers have been applied.  A left pleural tube and a Blake mediastinal drain were left in place.  Pericardium was loosely reapproximated.  Sternum was closed with #6 stainless steel wire. Fascia was closed with interrupted 0-Vicryl, running 3-0 Vicryl, subcutaneous tissue with 4-0 subcuticular stitch in skin edges.  Dry dressings were applied.  Sponge and needle counts were reported as correct at the completion of the procedure.  The patient tolerated the procedure without obvious complications and was transferred to the Surgical Intensive care Unit for further postoperative care.  Total pump time was 162 minutes.     Lanelle Bal, MD     EG/MEDQ  D:  10/20/2016  T:  10/21/2016  Job:  [id number]  cc:   Allegra Lai. Terrence Dupont, M.D.

## 2016-10-21 NOTE — Progress Notes (Signed)
Patient ID: Noah Cochran, male   DOB: [birth year], 66 y.o.   MRN: [id number] [facility] DAILY ICU PROGRESS NOTE                   [address]            [place] [address]          [phone]   2 Days Post-Op Procedure(s) (LRB): CORONARY ARTERY BYPASS GRAFTING (CABG) times five using left internal mammary artery and right saphenous vein.  Left mammary artery to Left anterior descending coronary artery, saphenous vein to diagonal, sequential vein to obtuse marginal and distal circumflex arteries, and vein graft to distal right coronary artery. (N/A) TRANSESOPHAGEAL ECHOCARDIOGRAM (TEE) (N/A)  Total Length of Stay:  LOS: 3 days   Subjective: Up to chair, alert, weaning neo off  Objective: Vital signs in last 24 hours: Temp:  [97.9 F (36.6 C)-99.1 F (37.3 C)] 97.9 F (36.6 C) (01/29 0400) Pulse Rate:  [86-104] 89 (01/29 0630) Cardiac Rhythm: Atrial paced (01/29 0630) Resp:  [10-26] 16 (01/29 0630) BP: (87-126)/(56-95) 100/71 (01/29 0630) SpO2:  [92 %-100 %] 96 % (01/29 0630) Arterial Line BP: (93-128)/(47-67) 110/58 (01/28 0830) Weight:  [136 lb (61.7 kg)] 136 lb (61.7 kg) (01/29 0500)  Filed Weights   10/19/16 0545 10/20/16 0412 10/21/16 0500  Weight: 122 lb 8 oz (55.6 kg) 131 lb 9.8 oz (59.7 kg) 136 lb (61.7 kg)    Weight change: 4 lb 6.2 oz (1.989 kg)   Hemodynamic parameters for last 24 hours: PAP: (19-30)/(7-15) 19/7 CO:  [4.9 L/min] 4.9 L/min CI:  [3 L/min/m2] 3 L/min/m2  Intake/Output from previous day: 01/28 0701 - 01/29 0700 In: 1213.1 [P.O.:180; I.V.:668.1; IV Piggyback:365] Out: 1515 [Urine:725; Chest Tube:790]  Intake/Output this shift: No intake/output data recorded.  Current Meds: Scheduled Meds: . acetaminophen  1,000 mg Oral Q6H   Or  . acetaminophen (TYLENOL) oral liquid 160 mg/5 mL  1,000 mg Per Tube Q6H  . aspirin EC  81 mg Oral Daily   Or  . aspirin  81 mg Per Tube Daily  . atorvastatin  80 mg Oral q1800  . bisacodyl  10 mg  Oral Daily   Or  . bisacodyl  10 mg Rectal Daily  . Chlorhexidine Gluconate Cloth  6 each Topical Daily  . docusate sodium  200 mg Oral Daily  . insulin aspart  0-24 Units Subcutaneous Q4H  . levalbuterol  0.63 mg Nebulization TID  . metoprolol tartrate  12.5 mg Oral BID   Or  . metoprolol tartrate  12.5 mg Per Tube BID  . mupirocin ointment  1 application Nasal BID  . pantoprazole  40 mg Oral Daily  . sodium chloride flush  3 mL Intravenous Q12H   Continuous Infusions: . sodium chloride Stopped (10/20/16 0900)  . sodium chloride Stopped (10/20/16 0600)  . sodium chloride Stopped (10/20/16 0900)  . dexmedetomidine Stopped (10/20/16 0400)  . lactated ringers 20 mL/hr at 10/21/16 0600  . nitroGLYCERIN Stopped (10/19/16 1430)  . phenylephrine (NEO-SYNEPHRINE) Adult infusion 10 mcg/min (10/21/16 0600)   PRN Meds:.sodium chloride, lactated ringers, metoprolol, midazolam, morphine injection, ondansetron (ZOFRAN) IV, sodium chloride flush, traMADol  General appearance: alert and cooperative Neurologic: intact Heart: regular rate and rhythm, S1, S2 normal, no murmur, click, rub or gallop Lungs: diminished breath sounds bibasilar Abdomen: soft, non-tender; bowel sounds normal; no masses,  no organomegaly Extremities: extremities normal, atraumatic, no cyanosis or edema and Homans sign is negative,  no sign of DVT Wound: sternum stable  Lab Results: CBC: Recent Labs  10/20/16 1615 10/20/16 1618 10/21/16 0411  WBC 9.3  --  9.2  HGB 8.6* 8.2* 8.1*  HCT 24.7* 24.0* 23.3*  PLT 100*  --  92*   BMET:  Recent Labs  10/20/16 0330  10/20/16 1618 10/21/16 0411  NA 139  --  140 138  K 4.7  --  3.7 3.8  CL 111  --  103 107  CO2 24  --   --  27  GLUCOSE 115*  --  155* 101*  BUN 8  --  13 12  CREATININE 0.77  < > 0.90 0.85  CALCIUM 7.4*  --   --  7.6*  < > = values in this interval not displayed.  CMET: Lab Results  Component Value Date   WBC 9.2 10/21/2016   HGB 8.1 (L)  10/21/2016   HCT 23.3 (L) 10/21/2016   PLT 92 (L) 10/21/2016   GLUCOSE 101 (H) 10/21/2016   CHOL 190 10/19/2016   TRIG 76 10/19/2016   HDL 60 10/19/2016   LDLCALC 115 (H) 10/19/2016   ALT 17 10/18/2016   AST 27 10/18/2016   NA 138 10/21/2016   K 3.8 10/21/2016   CL 107 10/21/2016   CREATININE 0.85 10/21/2016   BUN 12 10/21/2016   CO2 27 10/21/2016   TSH 1.07 03/07/2015   PSA 5.87 (H) 12/04/2015   INR 1.19 10/20/2016   HGBA1C 5.1 10/18/2016      PT/INR:  Recent Labs  10/20/16 0044  LABPROT 15.2  INR 1.19   Radiology: No results found.   Assessment/Plan: S/P Procedure(s) (LRB): CORONARY ARTERY BYPASS GRAFTING (CABG) times five using left internal mammary artery and right saphenous vein.  Left mammary artery to Left anterior descending coronary artery, saphenous vein to diagonal, sequential vein to obtuse marginal and distal circumflex arteries, and vein graft to distal right coronary artery. (N/A) TRANSESOPHAGEAL ECHOCARDIOGRAM (TEE) (N/A) Mobilize Diuresis Diabetes control d/c tubes/lines Wean off neo as tolerated Expected Acute  Blood - loss Anemia   Noah Cochran 10/21/2016 7:29 AM

## 2016-10-21 NOTE — Progress Notes (Signed)
CT surgery p.m. Rounds  Patient examined and record reviewed.Hemodynamics stable,labs satisfactory.Patient had stable day -- patient ambulated 3 times today.Continue current care. Tharon Aquas Trigt III 10/21/2016

## 2016-10-21 NOTE — Progress Notes (Signed)
Subjective:  Appreciate CVT S consult and help events of weekend noted. Patient denies any anginal chest pain or shortness of breath. Tolerated CABG 5. Objective:  Vital Signs in the last 24 hours: Temp:  [97.9 F (36.6 C)-98.7 F (37.1 C)] 98.3 F (36.8 C) (01/29 0800) Pulse Rate:  [72-104] 74 (01/29 1100) Resp:  [12-26] 13 (01/29 1100) BP: (87-126)/(56-95) 90/60 (01/29 1100) SpO2:  [92 %-100 %] 93 % (01/29 1100) Weight:  [136 lb (61.7 kg)] 136 lb (61.7 kg) (01/29 0500)  Intake/Output from previous day: 01/28 0701 - 01/29 0700 In: 1213.1 [P.O.:180; I.V.:668.1; IV Piggyback:365] Out: 1515 [Urine:725; Chest Tube:790] Intake/Output from this shift: Total I/O In: 60 [P.O.:60] Out: 220 [Urine:100; Chest Tube:120]  Physical Exam: Neck: no adenopathy, no carotid bruit, no JVD and supple, symmetrical, trachea midline Lungs: Clear anterolaterally Heart: Clear anterolaterally Abdomen: soft, non-tender; bowel sounds normal; no masses,  no organomegaly Extremities: extremities normal, atraumatic, no cyanosis or edema  Lab Results:  Recent Labs  10/20/16 1615 10/20/16 1618 10/21/16 0411  WBC 9.3  --  9.2  HGB 8.6* 8.2* 8.1*  PLT 100*  --  92*    Recent Labs  10/20/16 0330  10/20/16 1618 10/21/16 0411  NA 139  --  140 138  K 4.7  --  3.7 3.8  CL 111  --  103 107  CO2 24  --   --  27  GLUCOSE 115*  --  155* 101*  BUN 8  --  13 12  CREATININE 0.77  < > 0.90 0.85  < > = values in this interval not displayed.  Recent Labs  10/18/16 1839 10/18/16 2348  TROPONINI 0.28* 0.24*   Hepatic Function Panel  Recent Labs  10/18/16 1224  PROT 5.8*  ALBUMIN 3.3*  AST 27  ALT 17  ALKPHOS 53  BILITOT 0.6    Recent Labs  10/19/16 0304  CHOL 190   No results for input(s): PROTIME in the last 72 hours.  Imaging: Imaging results have been reviewed and Dg Chest Port 1 View  Result Date: 10/21/2016 CLINICAL DATA:  Status post CABG.  Chest tube drainage. EXAM: PORTABLE  CHEST 1 VIEW COMPARISON:  Portable chest x-ray of October 20, 2016 FINDINGS: The lungs are reasonably well inflated. Increased density at the right lung base has developed with partial obscuration of the hemidiaphragm. A E less than 10% left apical pneumothorax is present. The left-sided chest tube is in stable position with the tip projecting over the posterior aspect of the left fifth rib. There is no significant left pleural effusion. There is no mediastinal shift. The heart and pulmonary vascularity are normal. The sternal wires are intact. The Swan-Ganz catheter is been removed. The right internal jugular Cordis sheath tip projects over the proximal SVC. There is a moderate amount of gas within bowel loops in the upper abdomen. IMPRESSION: Interval development of right basilar atelectasis and small right pleural effusion. Minimal persistent right basilar atelectasis. Tiny left apical pneumothorax. The it measures between 5 and 10% of the lung volume. The left-sided chest tube is in stable position. No CHF. Electronically Signed   By: David  Martinique M.D.   On: 10/21/2016 07:28   Dg Chest Port 1 View  Result Date: 10/20/2016 CLINICAL DATA:  Status post CABG. EXAM: PORTABLE CHEST 1 VIEW COMPARISON:  10/19/2016 FINDINGS: Status post extubation and removal of enteric catheter. Swan-Ganz catheter and thoracic/mediastinal drain remain in place. Cardiomediastinal silhouette is normal. Mediastinal contours appear intact. There is  no evidence of pneumothorax. There may be small bilateral pleural effusions with bibasilar atelectasis, left greater than right. Osseous structures are without acute abnormality. Soft tissues are grossly normal. IMPRESSION: Status post extubation. Possible small bilateral pleural effusions with bibasilar atelectasis. Electronically Signed   By: Fidela Salisbury M.D.   On: 10/20/2016 07:53   Dg Chest Port 1 View  Result Date: 10/19/2016 CLINICAL DATA:  Status post CABG. EXAM: PORTABLE  CHEST 1 VIEW COMPARISON:  10/18/2006 pain FINDINGS: Median sternotomy and CABG changes noted. The cardiomediastinal silhouette is unchanged. An endotracheal tube with tip 0.9 cm above the carina, NG tube entering the stomach, right IJ Swan-Ganz catheter with tip overlying the main/right pulmonary artery, and mediastinal and left thoracostomy tubes noted. There is no evidence of pneumothorax or pulmonary edema. No pleural effusion identified. IMPRESSION: Status post CABG with changes and support apparatus as described. No evidence of pneumothorax. Electronically Signed   By: Margarette Canada M.D.   On: 10/19/2016 14:54    Cardiac Studies:  Assessment/Plan:  Acute coronary syndrome Multivessel CAD Status post CABG 5 postop day 2 doing well History of prior silent inferior wall MI Tobacco abuse Strong family history of coronary artery disease History of carcinoma of prostate Postop anemia Minimal apical left pneumothorax Plan Continue present management per CVTS   LOS: 3 days    Charolette Forward 10/21/2016, 12:11 PM

## 2016-10-22 ENCOUNTER — Inpatient Hospital Stay (HOSPITAL_COMMUNITY): Payer: BLUE CROSS/BLUE SHIELD

## 2016-10-22 LAB — CBC
HCT: 24.7 % — ABNORMAL LOW (ref 39.0–52.0)
Hemoglobin: 8.4 g/dL — ABNORMAL LOW (ref 13.0–17.0)
MCH: 30.1 pg (ref 26.0–34.0)
MCHC: 34 g/dL (ref 30.0–36.0)
MCV: 88.5 fL (ref 78.0–100.0)
Platelets: 113 10*3/uL — ABNORMAL LOW (ref 150–400)
RBC: 2.79 MIL/uL — ABNORMAL LOW (ref 4.22–5.81)
RDW: 14.3 % (ref 11.5–15.5)
WBC: 8.7 10*3/uL (ref 4.0–10.5)

## 2016-10-22 LAB — GLUCOSE, CAPILLARY
GLUCOSE-CAPILLARY: 89 mg/dL (ref 65–99)
Glucose-Capillary: 97 mg/dL (ref 65–99)
Glucose-Capillary: 88 mg/dL (ref 65–99)
Glucose-Capillary: 112 mg/dL — ABNORMAL HIGH (ref 65–99)
Glucose-Capillary: 98 mg/dL (ref 65–99)

## 2016-10-22 LAB — BASIC METABOLIC PANEL
Anion gap: 7 (ref 5–15)
BUN: 13 mg/dL (ref 6–20)
CO2: 26 mmol/L (ref 22–32)
Calcium: 8.1 mg/dL — ABNORMAL LOW (ref 8.9–10.3)
Chloride: 104 mmol/L (ref 101–111)
Creatinine, Ser: 0.79 mg/dL (ref 0.61–1.24)
GFR calc Af Amer: >60 mL/min (ref >60)
GFR calc non Af Amer: >60 mL/min (ref >60)
Glucose, Bld: 98 mg/dL (ref 65–99)
Potassium: 4.1 mmol/L (ref 3.5–5.1)
Sodium: 137 mmol/L (ref 135–145)

## 2016-10-22 MED ORDER — DOCUSATE SODIUM 100 MG PO CAPS
200.0000 mg | ORAL_CAPSULE | Freq: Every day | ORAL | Status: DC
  Filled 2016-10-22: qty 2

## 2016-10-22 MED ORDER — SODIUM CHLORIDE 0.9 % IV SOLN: 250.0000 mL | INTRAVENOUS | Status: DC | PRN

## 2016-10-22 MED ORDER — SODIUM CHLORIDE 0.9% FLUSH
3.0000 mL | Freq: Two times a day (BID) | INTRAVENOUS | Status: DC
  Administered 2016-10-22 – 2016-10-23 (×2): 3 mL via INTRAVENOUS

## 2016-10-22 MED ORDER — OXYCODONE HCL 5 MG PO TABS: 5.0000 mg | ORAL_TABLET | ORAL | Status: DC | PRN

## 2016-10-22 MED ORDER — SODIUM CHLORIDE 0.9% FLUSH: 3.0000 mL | INTRAVENOUS | Status: DC | PRN

## 2016-10-22 MED ORDER — MOVING RIGHT ALONG BOOK
Freq: Once | Status: AC
  Administered 2016-10-23: 08:00:00
  Filled 2016-10-22: qty 1

## 2016-10-22 MED FILL — Sodium Bicarbonate IV Soln 8.4%: INTRAVENOUS | Qty: 50 | Status: AC

## 2016-10-22 MED FILL — Lidocaine HCl IV Inj 20 MG/ML: INTRAVENOUS | Qty: 5 | Status: AC

## 2016-10-22 MED FILL — Mannitol IV Soln 20%: INTRAVENOUS | Qty: 500 | Status: AC

## 2016-10-22 MED FILL — Electrolyte-R (PH 7.4) Solution: INTRAVENOUS | Qty: 4000 | Status: AC

## 2016-10-22 MED FILL — Heparin Sodium (Porcine) Inj 1000 Unit/ML: INTRAMUSCULAR | Qty: 10 | Status: AC

## 2016-10-22 MED FILL — Sodium Chloride IV Soln 0.9%: INTRAVENOUS | Qty: 2000 | Status: AC

## 2016-10-22 NOTE — Care Management Note (Signed)
Case Management Note  Patient Details  Name: Noah Cochran MRN: [id number] Date of Birth: September 03, 1951  Subjective/Objective:  Pt lives alone but states his mother, who is more active than he is, plans to move in with him to provide 24/7 assistance.                         Expected Discharge Plan:  Home/Self Care  Status of Service:  In process, will continue to follow  Girard Cooter, RN 10/22/2016, 3:11 PM

## 2016-10-22 NOTE — Progress Notes (Signed)
CARDIAC REHAB PHASE I    Pt in bed, states he recently ambulated from the ICU, states he has walked twice today. Pt declines additional ambulation at this time (encouraged additional ambulation x1 today). Will follow up tomorrow.   Lenna Sciara, RN, BSN 10/22/2016 2:41 PM

## 2016-10-22 NOTE — Progress Notes (Signed)
Subjective:  Doing well up in chair denies any chest pain or shortness of breath. Complains of mild soreness in the throat  Objective:  Vital Signs in the last 24 hours: Temp:  [97.7 F (36.5 C)-98.4 F (36.9 C)] 98.3 F (36.8 C) (01/30 0811) Pulse Rate:  [74-90] 75 (01/30 0900) Resp:  [11-20] 20 (01/30 0900) BP: (90-122)/(59-77) 108/70 (01/30 0900) SpO2:  [92 %-100 %] 93 % (01/30 0900) Weight:  [133 lb 2.5 oz (60.4 kg)] 133 lb 2.5 oz (60.4 kg) (01/30 0500)  Intake/Output from previous day: 01/29 0701 - 01/30 0700 In: 300 [P.O.:60; I.V.:240] Out: 1200 [Urine:1000; Chest Tube:200] Intake/Output from this shift: Total I/O In: 10 [I.V.:10] Out: -   Physical Exam: Neck: supple, symmetrical, trachea midline and thyroid not enlarged, symmetric, no tenderness/mass/nodules Lungs: Decreased breath sound at bases clear anterolaterally Heart: regular rate and rhythm, S1, S2 normal and Soft systolic murmur noted Abdomen: soft, non-tender; bowel sounds normal; no masses,  no organomegaly Extremities: extremities normal, atraumatic, no cyanosis or edema  Lab Results:  Recent Labs  10/21/16 0411 10/22/16 0426  WBC 9.2 8.7  HGB 8.1* 8.4*  PLT 92* 113*    Recent Labs  10/21/16 0411 10/22/16 0426  NA 138 137  K 3.8 4.1  CL 107 104  CO2 27 26  GLUCOSE 101* 98  BUN 12 13  CREATININE 0.85 0.79   No results for input(s): TROPONINI in the last 72 hours.  Invalid input(s): CK, MB Hepatic Function Panel No results for input(s): PROT, ALBUMIN, AST, ALT, ALKPHOS, BILITOT, BILIDIR, IBILI in the last 72 hours. No results for input(s): CHOL in the last 72 hours. No results for input(s): PROTIME in the last 72 hours.  Imaging: Imaging results have been reviewed and Dg Chest Port 1 View  Result Date: 10/22/2016 CLINICAL DATA:  Status post CABG 3 days ago. Patient reports cough today. EXAM: PORTABLE CHEST 1 VIEW COMPARISON:  Portable chest x-ray of October 22, 2015 FINDINGS: The lungs  are well-expanded. There is no visible pneumothorax on the left today. There is increased density at the right lung base slightly more conspicuous today with partial obscuration of the hemidiaphragm. Minimal atelectasis at the left lung base persists. There is a trace of pleural fluid bilaterally. The heart and pulmonary vascularity are normal. The mediastinum is normal in width. The sternal wires are intact. The right internal jugular Cordis sheath tip projects over the proximal SVC. IMPRESSION: No CHF or pneumothorax. Small right pleural effusion with right basilar atelectasis or early pneumonia not greatly changed from yesterday's study. Minimal left retrocardiac atelectasis, stable. Electronically Signed   By: David  Martinique M.D.   On: 10/22/2016 08:12   Dg Chest Port 1 View  Result Date: 10/21/2016 CLINICAL DATA:  Status post CABG.  Chest tube drainage. EXAM: PORTABLE CHEST 1 VIEW COMPARISON:  Portable chest x-ray of October 20, 2016 FINDINGS: The lungs are reasonably well inflated. Increased density at the right lung base has developed with partial obscuration of the hemidiaphragm. A E less than 10% left apical pneumothorax is present. The left-sided chest tube is in stable position with the tip projecting over the posterior aspect of the left fifth rib. There is no significant left pleural effusion. There is no mediastinal shift. The heart and pulmonary vascularity are normal. The sternal wires are intact. The Swan-Ganz catheter is been removed. The right internal jugular Cordis sheath tip projects over the proximal SVC. There is a moderate amount of gas within bowel loops in  the upper abdomen. IMPRESSION: Interval development of right basilar atelectasis and small right pleural effusion. Minimal persistent right basilar atelectasis. Tiny left apical pneumothorax. The it measures between 5 and 10% of the lung volume. The left-sided chest tube is in stable position. No CHF. Electronically Signed   By: David   Martinique M.D.   On: 10/21/2016 07:28    Cardiac Studies:  Assessment/Plan:  Acute coronary syndrome Multivessel CAD Status post CABG 5 postop day3 doing well History of prior silent inferior wall MI Tobacco abuse Strong family history of coronary artery disease History of carcinoma of prostate Postop anemia Plan Increase ambulation as tolerated Incentive spirometry Continue present management as per CVTS  LOS: 4 days    Charolette Forward 10/22/2016, 10:05 AM

## 2016-10-22 NOTE — Significant Event (Addendum)
Patient transferred to [name], ambulated there with RN [facility]. All personal belongings taken with patient. Patient's mother notified of the transfer. Report given to receiving RN Lattie Haw.         Noah Cochran

## 2016-10-22 NOTE — Progress Notes (Addendum)
      [address]       [place] [address]             [phone]      3 Days Post-Op Procedure(s) (LRB): CORONARY ARTERY BYPASS GRAFTING (CABG) times five using left internal mammary artery and right saphenous vein.  Left mammary artery to Left anterior descending coronary artery, saphenous vein to diagonal, sequential vein to obtuse marginal and distal circumflex arteries, and vein graft to distal right coronary artery. (N/A) TRANSESOPHAGEAL ECHOCARDIOGRAM (TEE) (N/A) Subjective: No issues this morning  Objective: Vital signs in last 24 hours: Temp:  [97.7 F (36.5 C)-98.4 F (36.9 C)] 98.3 F (36.8 C) (01/30 0811) Pulse Rate:  [74-90] 74 (01/30 0800) Cardiac Rhythm: Normal sinus rhythm (01/30 0800) Resp:  [11-18] 16 (01/30 0800) BP: (90-122)/(59-77) 107/66 (01/30 0800) SpO2:  [92 %-100 %] 96 % (01/30 0800) Weight:  [133 lb 2.5 oz (60.4 kg)] 133 lb 2.5 oz (60.4 kg) (01/30 0500)     Intake/Output from previous day: 01/29 0701 - 01/30 0700 In: 300 [P.O.:60; I.V.:240] Out: 1200 [Urine:1000; Chest Tube:200] Intake/Output this shift: Total I/O In: 10 [I.V.:10] Out: -   General appearance: alert, cooperative and no distress Heart: regular rate and rhythm, S1, S2 normal, no murmur, click, rub or gallop Lungs: clear to auscultation bilaterally Abdomen: soft, non-tender; bowel sounds normal; no masses,  no organomegaly Extremities: extremities normal, atraumatic, no cyanosis or edema Wound: clean and dry covered with a sterile dressing  Lab Results:  Recent Labs  10/21/16 0411 10/22/16 0426  WBC 9.2 8.7  HGB 8.1* 8.4*  HCT 23.3* 24.7*  PLT 92* 113*   BMET:  Recent Labs  10/21/16 0411 10/22/16 0426  NA 138 137  K 3.8 4.1  CL 107 104  CO2 27 26  GLUCOSE 101* 98  BUN 12 13  CREATININE 0.85 0.79  CALCIUM 7.6* 8.1*    PT/INR:  Recent Labs  10/20/16 0044  LABPROT 15.2  INR 1.19   ABG    Component Value Date/Time   PHART 7.364  10/20/2016 0454   HCO3 23.4 10/20/2016 0454   TCO2 23 10/20/2016 1618   ACIDBASEDEF 2.0 10/20/2016 0454   O2SAT 97.0 10/20/2016 0454   CBG (last 3)   Recent Labs  10/21/16 2347 10/22/16 0420 10/22/16 0810  GLUCAP 97 97 88    Assessment/Plan: S/P Procedure(s) (LRB): CORONARY ARTERY BYPASS GRAFTING (CABG) times five using left internal mammary artery and right saphenous vein.  Left mammary artery to Left anterior descending coronary artery, saphenous vein to diagonal, sequential vein to obtuse marginal and distal circumflex arteries, and vein graft to distal right coronary artery. (N/A) TRANSESOPHAGEAL ECHOCARDIOGRAM (TEE) (N/A)  1. CV-NSR in the 70s with some PVCs. BP well controlled. On low-dose lopressor. Weaned off of pressors 2. Pulm- tolerating room air without issues.CXR shows small right pleural effusion with atelectasis.  3. Renal- urine output okay. Fluid balance remains negative. Creatinine 0.79. Electrolytes okay.  4. H and H stable.  5. Endocrine-well controlled blood glucose level  Plan: Transfer to the telemetry unit. Discontinue central line.    LOS: 4 days    Elgie Collard 10/22/2016  Stable on 2 w  Poss home 2 days  Wires out tomorrow  I have seen and examined Thereasa Distance and agree with the above assessment  and plan.  [name] MD Beeper [phone] Office [phone] 10/22/2016 8:04 PM

## 2016-10-23 ENCOUNTER — Inpatient Hospital Stay (HOSPITAL_COMMUNITY): Payer: BLUE CROSS/BLUE SHIELD

## 2016-10-23 LAB — BASIC METABOLIC PANEL
Anion gap: 9 (ref 5–15)
BUN: 10 mg/dL (ref 6–20)
CO2: 24 mmol/L (ref 22–32)
CREATININE: 0.88 mg/dL (ref 0.61–1.24)
Calcium: 8 mg/dL — ABNORMAL LOW (ref 8.9–10.3)
Chloride: 105 mmol/L (ref 101–111)
GFR calc Af Amer: >60 mL/min (ref >60)
GLUCOSE: 97 mg/dL (ref 65–99)
POTASSIUM: 3.9 mmol/L (ref 3.5–5.1)
SODIUM: 138 mmol/L (ref 135–145)

## 2016-10-23 LAB — GLUCOSE, CAPILLARY
GLUCOSE-CAPILLARY: 115 mg/dL — AB (ref 65–99)
Glucose-Capillary: 107 mg/dL — ABNORMAL HIGH (ref 65–99)
Glucose-Capillary: 114 mg/dL — ABNORMAL HIGH (ref 65–99)
Glucose-Capillary: 98 mg/dL (ref 65–99)
Glucose-Capillary: 99 mg/dL (ref 65–99)

## 2016-10-23 LAB — CBC
HCT: 25.7 % — ABNORMAL LOW (ref 39.0–52.0)
Hemoglobin: 8.8 g/dL — ABNORMAL LOW (ref 13.0–17.0)
MCH: 30.3 pg (ref 26.0–34.0)
MCHC: 34.2 g/dL (ref 30.0–36.0)
MCV: 88.6 fL (ref 78.0–100.0)
PLATELETS: 161 10*3/uL (ref 150–400)
RBC: 2.9 MIL/uL — AB (ref 4.22–5.81)
RDW: 14.1 % (ref 11.5–15.5)
WBC: 9.5 10*3/uL (ref 4.0–10.5)

## 2016-10-23 MED ORDER — METOPROLOL TARTRATE 25 MG PO TABS
25.0000 mg | ORAL_TABLET | Freq: Two times a day (BID) | ORAL | Status: DC
  Administered 2016-10-23 – 2016-10-24 (×3): 25 mg via ORAL
  Filled 2016-10-23 (×4): qty 1

## 2016-10-23 MED ORDER — LEVALBUTEROL HCL 0.63 MG/3ML IN NEBU
0.6300 mg | INHALATION_SOLUTION | Freq: Two times a day (BID) | RESPIRATORY_TRACT | Status: DC
  Administered 2016-10-23 – 2016-10-24 (×2): 0.63 mg via RESPIRATORY_TRACT
  Filled 2016-10-23 (×2): qty 3

## 2016-10-23 MED ORDER — FUROSEMIDE 20 MG PO TABS
20.0000 mg | ORAL_TABLET | Freq: Every day | ORAL | Status: DC
  Administered 2016-10-23 – 2016-10-24 (×2): 20 mg via ORAL
  Filled 2016-10-23 (×3): qty 1

## 2016-10-23 MED ORDER — POTASSIUM CHLORIDE CRYS ER 10 MEQ PO TBCR
10.0000 meq | EXTENDED_RELEASE_TABLET | Freq: Every day | ORAL | Status: DC
  Administered 2016-10-23 – 2016-10-24 (×2): 10 meq via ORAL
  Filled 2016-10-23 (×4): qty 1

## 2016-10-23 MED ORDER — METOPROLOL TARTRATE 25 MG/10 ML ORAL SUSPENSION: 12.5000 mg | Freq: Two times a day (BID) | ORAL | Status: DC

## 2016-10-23 MED ORDER — LISINOPRIL 2.5 MG PO TABS
2.5000 mg | ORAL_TABLET | Freq: Every day | ORAL | Status: DC
  Administered 2016-10-23 – 2016-10-24 (×2): 2.5 mg via ORAL
  Filled 2016-10-23 (×3): qty 1

## 2016-10-23 NOTE — Discharge Instructions (Signed)
Steps to Quit Smoking Smoking tobacco can be bad for your health. It can also affect almost every organ in your body. Smoking puts you and people around you at risk for many serious long-lasting (chronic) diseases. Quitting smoking is hard, but it is one of the best things that you can do for your health. It is never too late to quit. What are the benefits of quitting smoking? When you quit smoking, you lower your risk for getting serious diseases and conditions. They can include:  Lung cancer or lung disease.  Heart disease.  Stroke.  Heart attack.  Not being able to have children (infertility).  Weak bones (osteoporosis) and broken bones (fractures). If you have coughing, wheezing, and shortness of breath, those symptoms may get better when you quit. You may also get sick less often. If you are pregnant, quitting smoking can help to lower your chances of having a baby of low birth weight. What can I do to help me quit smoking? Talk with your doctor about what can help you quit smoking. Some things you can do (strategies) include:  Quitting smoking totally, instead of slowly cutting back how much you smoke over a period of time.  Going to in-person counseling. You are more likely to quit if you go to many counseling sessions.  Using resources and support systems, such as:  Online chats with a Social worker.  Phone quitlines.  Printed Furniture conservator/restorer.  Support groups or group counseling.  Text messaging programs.  Mobile phone apps or applications.  Taking medicines. Some of these medicines may have nicotine in them. If you are pregnant or breastfeeding, do not take any medicines to quit smoking unless your doctor says it is okay. Talk with your doctor about counseling or other things that can help you. Talk with your doctor about using more than one strategy at the same time, such as taking medicines while you are also going to in-person counseling. This can help make quitting  easier. What things can I do to make it easier to quit? Quitting smoking might feel very hard at first, but there is a lot that you can do to make it easier. Take these steps:  Talk to your family and friends. Ask them to support and encourage you.  Call phone quitlines, reach out to support groups, or work with a Social worker.  Ask people who smoke to not smoke around you.  Avoid places that make you want (trigger) to smoke, such as:  Bars.  Parties.  Smoke-break areas at work.  Spend time with people who do not smoke.  Lower the stress in your life. Stress can make you want to smoke. Try these things to help your stress:  Getting regular exercise.  Deep-breathing exercises.  Yoga.  Meditating.  Doing a body scan. To do this, close your eyes, focus on one area of your body at a time from head to toe, and notice which parts of your body are tense. Try to relax the muscles in those areas.  Download or buy apps on your mobile phone or tablet that can help you stick to your quit plan. There are many free apps, such as QuitGuide from the [employer] Office manager for Disease Control and Prevention). You can find more support from smokefree.gov and other websites. This information is not intended to replace advice given to you by your health care provider. Make sure you discuss any questions you have with your health care provider. Document Released: 07/06/2009 Document Revised: 05/07/2016 Document  Reviewed: 01/24/2015 Elsevier Interactive Patient Education  2017 [employer]. Endoscopic Saphenous Vein Harvesting, Care After Introduction Refer to this sheet in the next few weeks. These instructions provide you with information about caring for yourself after your procedure. Your health care provider may also give you more specific instructions. Your treatment has been planned according to current medical practices, but problems sometimes occur. Call your health care provider if you have any  problems or questions after your procedure. What can I expect after the procedure? After the procedure, it is common to have:  Pain.  Bruising.  Swelling.  Numbness. Follow these instructions at home: Medicine  Take over-the-counter and prescription medicines only as told by your health care provider.  Do not drive or operate heavy machinery while taking prescription pain medicine. Incision care  Follow instructions from your health care provider about how to take care of the cut made during surgery (incision). Make sure you:  Wash your hands with soap and water before you change your bandage (dressing). If soap and water are not available, use hand sanitizer.  Change your dressing as told by your health care provider.  Leave stitches (sutures), skin glue, or adhesive strips in place. These skin closures may need to be in place for 2 weeks or longer. If adhesive strip edges start to loosen and curl up, you may trim the loose edges. Do not remove adhesive strips completely unless your health care provider tells you to do that.  Check your incision area every day for signs of infection. Check for:  More redness, swelling, or pain.  More fluid or blood.  Warmth.  Pus or a bad smell. General instructions  Raise (elevate) your legs above the level of your heart while you are sitting or lying down.  Do any exercises your health care providers have given you. These may include deep breathing, coughing, and walking exercises.  Do not shower, take baths, swim, or use a hot tub unless told by your health care provider.  Wear your elastic stocking if told by your health care provider.  Keep all follow-up visits as told by your health care provider. This is important. Contact a health care provider if:  Medicine does not help your pain.  Your pain gets worse.  You have new leg bruises or your leg bruises get bigger.  You have a fever.  Your leg feels numb.  You have  more redness, swelling, or pain around your incision.  You have more fluid or blood coming from your incision.  Your incision feels warm to the touch.  You have pus or a bad smell coming from your incision. Get help right away if:  Your pain is severe.  You develop pain, tenderness, warmth, redness, or swelling in any part of your leg.  You have chest pain.  You have trouble breathing. This information is not intended to replace advice given to you by your health care provider. Make sure you discuss any questions you have with your health care provider. Document Released: 05/22/2011 Document Revised: 02/15/2016 Document Reviewed: 07/24/2015  2017 Elsevier Coronary Artery Bypass Grafting, Care After These instructions give you information on caring for yourself after your procedure. Your doctor may also give you more specific instructions. Call your doctor if you have any problems or questions after your procedure. Follow these instructions at home:  Only take medicine as told by your doctor. Take medicines exactly as told. Do not stop taking medicines or start any new medicines without  talking to your doctor first.  Take your pulse as told by your doctor.  Do deep breathing as told by your doctor. Use your breathing device (incentive spirometer), if given, to practice deep breathing several times a day. Support your chest with a pillow or your arms when you take deep breaths or cough.  Keep the area clean, dry, and protected where the surgery cuts (incisions) were made. Remove bandages (dressings) only as told by your doctor. If strips were applied to surgical area, do not take them off. They fall off on their own.  Check the surgery area daily for puffiness (swelling), redness, or leaking fluid.  If surgery cuts were made in your legs:  Avoid crossing your legs.  Avoid sitting for long periods of time. Change positions every 30 minutes.  Raise your legs when you are sitting.  Place them on pillows.  Wear stockings that help keep blood clots from forming in your legs (compression stockings).  Only take sponge baths until your doctor says it is okay to take showers. Pat the surgery area dry. Do not rub the surgery area with a washcloth or towel. Do not bathe, swim, or use a hot tub until your doctor says it is okay.  Eat foods that are high in fiber. These include raw fruits and vegetables, whole grains, beans, and nuts. Choose lean meats. Avoid canned, processed, and fried foods.  Drink enough fluids to keep your pee (urine) clear or pale yellow.  Weigh yourself every day.  Rest and limit activity as told by your doctor. You may be told to:  Stop any activity if you have chest pain, shortness of breath, changes in heartbeat, or dizziness. Get help right away if this happens.  Move around often for short amounts of time or take short walks as told by your doctor. Gradually become more active. You may need help to strengthen your muscles and build endurance.  Avoid lifting, pushing, or pulling anything heavier than 10 pounds (4.5 kg) for at least 6 weeks after surgery.  Do not drive until your doctor says it is okay.  Ask your doctor when you can go back to work.  Ask your doctor when you can begin sexual activity again.  Follow up with your doctor as told. Contact a doctor if:  You have puffiness, redness, more pain, or fluid draining from the incision site.  You have a fever.  You have puffiness in your ankles or legs.  You have pain in your legs.  You gain 2 or more pounds (0.9 kg) a day.  You feel sick to your stomach (nauseous) or throw up (vomit).  You have watery poop (diarrhea). Get help right away if:  You have chest pain that goes to your jaw or arms.  You have shortness of breath.  You have a fast or irregular heartbeat.  You notice a "clicking" in your breastbone when you move.  You have numbness or weakness in your arms or  legs.  You feel dizzy or light-headed. This information is not intended to replace advice given to you by your health care provider. Make sure you discuss any questions you have with your health care provider. Document Released: 09/14/2013 Document Revised: 02/15/2016 Document Reviewed: 02/16/2013 Elsevier Interactive Patient Education  2017 [employer].

## 2016-10-23 NOTE — Progress Notes (Signed)
EPW d/c'd per order and per protocol. Tips intact. VSS. Pt tolerated well. Pt educated on need for q58min vitals and bedrest x1h. Call bell and phone within reach. Will continue to monitor.

## 2016-10-23 NOTE — Progress Notes (Signed)
Subjective:   Doing well denies any chest pain or shortness of breath.  Objective:  Vital Signs in the last 24 hours: Temp:  [97.4 F (36.3 C)-98.6 F (37 C)] 98.6 F (37 C) (01/31 0415) Pulse Rate:  [77-110] 86 (01/31 1037) Resp:  [18-21] 20 (01/31 0415) BP: (93-133)/(63-78) 107/71 (01/31 1037) SpO2:  [93 %-97 %] 96 % (01/31 0832) Weight:  [128 lb 12.8 oz (58.4 kg)] 128 lb 12.8 oz (58.4 kg) (01/31 0415)  Intake/Output from previous day: 01/30 0701 - 01/31 0700 In: 370 [P.O.:340; I.V.:30] Out: 301 [Urine:300; Stool:1] Intake/Output from this shift: Total I/O In: 360 [P.O.:360] Out: -   Physical Exam: Neck: no adenopathy, no carotid bruit, no JVD and supple, symmetrical, trachea midline Lungs: Decreased breath sound at bases Heart: Tachycardic S1-S2 soft there is soft systolic murmur no pericardial rub  Abdomen: soft, non-tender; bowel sounds normal; no masses,  no organomegaly Extremities: extremities normal, atraumatic, no cyanosis or edema  Lab Results:  Recent Labs  10/22/16 0426 10/23/16 0346  WBC 8.7 9.5  HGB 8.4* 8.8*  PLT 113* 161    Recent Labs  10/22/16 0426 10/23/16 0346  NA 137 138  K 4.1 3.9  CL 104 105  CO2 26 24  GLUCOSE 98 97  BUN 13 10  CREATININE 0.79 0.88   No results for input(s): TROPONINI in the last 72 hours.  Invalid input(s): CK, MB Hepatic Function Panel No results for input(s): PROT, ALBUMIN, AST, ALT, ALKPHOS, BILITOT, BILIDIR, IBILI in the last 72 hours. No results for input(s): CHOL in the last 72 hours. No results for input(s): PROTIME in the last 72 hours.  Imaging: Imaging results have been reviewed and Dg Chest 2 View  Result Date: 10/23/2016 CLINICAL DATA:  CABG. EXAM: CHEST  2 VIEW COMPARISON:  10/22/2016. FINDINGS: Interim removal right IJ sheath. Prior CABG. Heart size normal. No focal infiltrate. Mild basilar atelectasis. Small bilateral pleural effusions. No pneumothorax. IMPRESSION: 1.  Interim removal right IJ  sheath. 2.  Prior CABG.  Heart size normal. 3. Mild basilar subsegmental atelectasis. Small bilateral pleural effusions. Electronically Signed   By: Marcello Moores  Register   On: 10/23/2016 07:23   Dg Chest Port 1 View  Result Date: 10/22/2016 CLINICAL DATA:  Status post CABG 3 days ago. Patient reports cough today. EXAM: PORTABLE CHEST 1 VIEW COMPARISON:  Portable chest x-ray of October 22, 2015 FINDINGS: The lungs are well-expanded. There is no visible pneumothorax on the left today. There is increased density at the right lung base slightly more conspicuous today with partial obscuration of the hemidiaphragm. Minimal atelectasis at the left lung base persists. There is a trace of pleural fluid bilaterally. The heart and pulmonary vascularity are normal. The mediastinum is normal in width. The sternal wires are intact. The right internal jugular Cordis sheath tip projects over the proximal SVC. IMPRESSION: No CHF or pneumothorax. Small right pleural effusion with right basilar atelectasis or early pneumonia not greatly changed from yesterday's study. Minimal left retrocardiac atelectasis, stable. Electronically Signed   By: David  Martinique M.D.   On: 10/22/2016 08:12    Cardiac Studies:  Assessment/Plan:  Acute coronary syndrome Multivessel CAD Status post CABG 5 postop day4 doing well History of prior silent inferior wall MI Tobacco abuse Strong family history of coronary artery disease History of carcinoma of prostate Postop anemia Plan Plan agree with increasing beta blockers as tolerated Increase ambulation as tolerated Encouraged incentive spirometry  LOS: 5 days    Noah Cochran  10/23/2016, 10:53 AM

## 2016-10-23 NOTE — Progress Notes (Addendum)
[address]       [place] [address]             [phone]      4 Days Post-Op Procedure(s) (LRB): CORONARY ARTERY BYPASS GRAFTING (CABG) times five using left internal mammary artery and right saphenous vein.  Left mammary artery to Left anterior descending coronary artery, saphenous vein to diagonal, sequential vein to obtuse marginal and distal circumflex arteries, and vein graft to distal right coronary artery. (N/A) TRANSESOPHAGEAL ECHOCARDIOGRAM (TEE) (N/A) Subjective: Feeling pretty well, some productive cough and sore throat  Objective: Vital signs in last 24 hours: Temp:  [97.4 F (36.3 C)-98.6 F (37 C)] 98.6 F (37 C) (01/31 0415) Pulse Rate:  [74-93] 93 (01/31 0415) Cardiac Rhythm: Normal sinus rhythm (01/30 1900) Resp:  [16-32] 20 (01/31 0415) BP: (93-133)/(66-78) 122/76 (01/31 0415) SpO2:  [93 %-98 %] 93 % (01/31 0415) Weight:  [128 lb 12.8 oz (58.4 kg)] 128 lb 12.8 oz (58.4 kg) (01/31 0415)  Hemodynamic parameters for last 24 hours:    Intake/Output from previous day: 01/30 0701 - 01/31 0700 In: 370 [P.O.:340; I.V.:30] Out: 301 [Urine:300; Stool:1] Intake/Output this shift: No intake/output data recorded.  General appearance: alert, cooperative and no distress Heart: regular rate and rhythm Lungs: mildly dim in left base, no wheeze or ronchi Abdomen: benign Extremities: minor edema Wound: incis healing well  Lab Results:  Recent Labs  10/22/16 0426 10/23/16 0346  WBC 8.7 9.5  HGB 8.4* 8.8*  HCT 24.7* 25.7*  PLT 113* 161   BMET:  Recent Labs  10/22/16 0426 10/23/16 0346  NA 137 138  K 4.1 3.9  CL 104 105  CO2 26 24  GLUCOSE 98 97  BUN 13 10  CREATININE 0.79 0.88  CALCIUM 8.1* 8.0*    PT/INR: No results for input(s): LABPROT, INR in the last 72 hours. ABG    Component Value Date/Time   PHART 7.364 10/20/2016 0454   HCO3 23.4 10/20/2016 0454   TCO2 23 10/20/2016 1618   ACIDBASEDEF 2.0 10/20/2016 0454   O2SAT 97.0 10/20/2016 0454   CBG (last 3)   Recent Labs  10/22/16 2007 10/23/16 0004 10/23/16 0412  GLUCAP 98 99 98    Meds Scheduled Meds: . acetaminophen  1,000 mg Oral Q6H   Or  . acetaminophen (TYLENOL) oral liquid 160 mg/5 mL  1,000 mg Per Tube Q6H  . aspirin EC  81 mg Oral Daily   Or  . aspirin  81 mg Per Tube Daily  . atorvastatin  80 mg Oral q1800  . bisacodyl  10 mg Oral Daily   Or  . bisacodyl  10 mg Rectal Daily  . docusate sodium  200 mg Oral Daily  . folic acid  1 mg Oral Daily  . insulin aspart  0-24 Units Subcutaneous Q4H  . levalbuterol  0.63 mg Nebulization TID  . metoprolol tartrate  12.5 mg Oral BID   Or  . metoprolol tartrate  12.5 mg Per Tube BID  . mupirocin ointment  1 application Nasal BID  . pantoprazole  40 mg Oral Daily  . sodium chloride flush  3 mL Intravenous Q12H   Continuous Infusions: PRN Meds:.sodium chloride, metoprolol, ondansetron (ZOFRAN) IV, oxyCODONE, phenol, sodium chloride flush, traMADol  Xrays Dg Chest 2 View  Result Date: 10/23/2016 CLINICAL DATA:  CABG. EXAM: CHEST  2 VIEW COMPARISON:  10/22/2016. FINDINGS: Interim removal right IJ sheath. Prior CABG. Heart size normal. No focal infiltrate.  Mild basilar atelectasis. Small bilateral pleural effusions. No pneumothorax. IMPRESSION: 1.  Interim removal right IJ sheath. 2.  Prior CABG.  Heart size normal. 3. Mild basilar subsegmental atelectasis. Small bilateral pleural effusions. Electronically Signed   By: Marcello Moores  Register   On: 10/23/2016 07:23   Dg Chest Port 1 View  Result Date: 10/22/2016 CLINICAL DATA:  Status post CABG 3 days ago. Patient reports cough today. EXAM: PORTABLE CHEST 1 VIEW COMPARISON:  Portable chest x-ray of October 22, 2015 FINDINGS: The lungs are well-expanded. There is no visible pneumothorax on the left today. There is increased density at the right lung base slightly more conspicuous today with partial obscuration of the hemidiaphragm. Minimal  atelectasis at the left lung base persists. There is a trace of pleural fluid bilaterally. The heart and pulmonary vascularity are normal. The mediastinum is normal in width. The sternal wires are intact. The right internal jugular Cordis sheath tip projects over the proximal SVC. IMPRESSION: No CHF or pneumothorax. Small right pleural effusion with right basilar atelectasis or early pneumonia not greatly changed from yesterday's study. Minimal left retrocardiac atelectasis, stable. Electronically Signed   By: David  Martinique M.D.   On: 10/22/2016 08:12    Assessment/Plan: S/P Procedure(s) (LRB): CORONARY ARTERY BYPASS GRAFTING (CABG) times five using left internal mammary artery and right saphenous vein.  Left mammary artery to Left anterior descending coronary artery, saphenous vein to diagonal, sequential vein to obtuse marginal and distal circumflex arteries, and vein graft to distal right coronary artery. (N/A) TRANSESOPHAGEAL ECHOCARDIOGRAM (TEE) (N/A)  1 doing well 2 sinus tachy at times- increase beta blocker, otherwise hemodyn stable 3  Some edema- will diurese some 4 push pulm toilet and cardiac rehab 5 sugars controlled 6 anemia is stable, renal fxn stable 7 d/c wires, poss home in am    LOS: 5 days    GOLD,WAYNE E 10/23/2016  Stable day Poss home in am I have seen and examined Thereasa Distance and agree with the above assessment  and plan.  [name] MD Beeper [phone] Office [phone] 10/23/2016 7:00 PM

## 2016-10-23 NOTE — Discharge Summary (Signed)
Physician Discharge Summary  Patient ID: Noah Cochran MRN: [id number] DOB/AGE: 12/19/1950 66 y.o.  Admit date: 10/18/2016 Discharge date: 10/24/2016  Admission Diagnoses: Code STEMI  Discharge Diagnoses:  Active Problems:   Acute MI, inferoposterior wall (HCC)   S/P CABG x 5  Patient Active Problem List   Diagnosis Date Noted  . S/P CABG x 5 10/19/2016  . Acute MI, inferoposterior wall ([place]) 10/18/2016  . PCP NOTES >>>>>>>>>>>>>>>>>>>>>>>> 03/05/2016  . Prostate cancer ([place]) 02/29/2016  . Annual physical exam 03/03/2015  . Allergic reaction to bee sting 05/01/2011  . Decreased hearing 05/01/2011   History of Present Illness:  at time of consultation    Patient is a 66 year old male with a past medical history of prostate cancer and substantial tobacco abuse times 40 years with ongoing smoking. He presented via EMS as a code STEMI. He had retrosternal chest pain associated with diaphoresis, shortness of breath with radiation to the shoulders. The pain was described as 8 over 10 in severity. Cardiology consultation was obtained with Dr. Terrence Dupont  took him emergently to the Cath Lab. He was found to have severe multivessel coronary artery disease as described in the report. The circumflex is felt to be the probable culprit lesion. Peak troponin so far 0.24. He has a significant family history of coronary disease.   Patient noted taht pain stopped in EMS on way to ER, Currently he is in CCU without chest pain   A cardiothoracic surgical consultation was obtained with Dr. Servando Snare who evaluated the patient and his studies and agree with recommendations to proceed with coronary artery surgical revascularization.   Discharged Condition: good  Hospital Course: The patient was admitted as a code STEMI and taken urgently to the cardiac catheterization lab by Dr. Terrence Dupont. He was found to have severe multivessel coronary artery disease. It was felt that the best revascularization option for  this patient would be CABG. He was medically stabilized on Integrilin and surgery was scheduled. On 10/19/2016 he was taken to the operating room where he underwent the below described procedure. He tolerated the procedure well and was taken to the surgical intensive care unit in stable condition.  Postoperative hospital course:  Overall the patient has progressed quite nicely. He was weaned from the ventilator without difficulty using standard protocols. He has some postoperative coagulopathy requiring FFP and DDAVP. He does have an expected acute blood loss anemia and values have stabilized. All routine lines, monitors and drainage devices have been discontinued in the standard fashion. He has postoperative volume overload which is responding well to diuretics. He is tolerating gradually increasing activities using standard cardiac rehabilitation modalities. Incisions are healing well without evidence of infection. He is tolerating diet. He is been weaned from oxygen and maintained good saturations on room air. At time of discharge he is felt to be quite stable.  Consults: cardiology  Significant Diagnostic Studies: angiography: cardiac cath  Treatments: surgery:  DATE OF PROCEDURE:  10/19/2016 DATE OF DISCHARGE:                              OPERATIVE REPORT   PREOPERATIVE DIAGNOSIS:  Coronary occlusive disease presenting with ST- elevation myocardial infarction.  POSTOPERATIVE DIAGNOSIS:  Coronary occlusive disease presenting with ST- elevation myocardial infarction.  SURGICAL PROCEDURE:  Emergent coronary artery bypass grafting x5 with the left internal mammary to the left anterior descending coronary artery, reverse saphenous vein graft to the diagonal coronary artery,  sequential reverse saphenous vein graft to the first obtuse marginal and distal circumflex, reverse saphenous vein graft to the distal right coronary artery with right thigh and calf, greater saphenous  vein endoscopic harvesting, placement of femoral arterial, ultrasound-guided.  SURGEON:  Lanelle Bal, MD  FIRST ASSISTANT:  John Giovanni, PA-C    Discharge Exam: Blood pressure 126/70, pulse 90, temperature 99.1 F (37.3 C), temperature source Oral, resp. rate 18, height 5\' 8"  (1.727 m), weight 125 lb 6.4 oz (56.9 kg), SpO2 97 %.  General appearance: alert, cooperative and no distress Heart: regular rate and rhythm Lungs: clear to auscultation bilaterally Abdomen: benign Extremities: minor edema Wound: incis healing well Disposition: 01-Home or Self Care   Allergies as of 10/24/2016      Reactions   Bee Venom Anaphylaxis      Medication List    TAKE these medications   aspirin EC 81 MG tablet Take 81 mg by mouth daily.   atorvastatin 80 MG tablet Commonly known as:  LIPITOR Take 1 tablet (80 mg total) by mouth daily at 6 PM.   EPINEPHrine 0.3 mg/0.3 mL Soaj injection Commonly known as:  EPIPEN 2-PAK Inject 0.3 mLs (0.3 mg total) into the muscle once.   folic acid 1 MG tablet Commonly known as:  FOLVITE Take 1 tablet (1 mg total) by mouth daily.   furosemide 20 MG tablet Commonly known as:  LASIX Take 1 tablet (20 mg total) by mouth daily.   lisinopril 2.5 MG tablet Commonly known as:  PRINIVIL,ZESTRIL Take 1 tablet (2.5 mg total) by mouth daily.   metoprolol tartrate 25 MG tablet Commonly known as:  LOPRESSOR Take 1 tablet (25 mg total) by mouth 2 (two) times daily.   oxyCODONE 5 MG immediate release tablet Commonly known as:  Oxy IR/ROXICODONE Take 1-2 tablets (5-10 mg total) by mouth every 6 (six) hours as needed for severe pain.   potassium chloride 10 MEQ tablet Commonly known as:  K-DUR,KLOR-CON Take 1 tablet (10 mEq total) by mouth daily.      Follow-up Information    Charolette Forward, MD Follow up.   Specialty:  Cardiology Why:  please contact office to arrange a 2 week appointment with your cardiologist Contact information: [address]  [place] [address] [phone]        [name], MD Follow up.   Specialty:  Cardiothoracic Surgery Why:  Please see discharge instruction paperwork. Also obtain a PA and lateral chest x-ray at [facility] imaging one half hour prior to appointment to see the surgeon. It is located in the same office complex.  Contact information: [address] [place] [address] [phone]         History of Present Illness:    Patient is a 66 year old male with a past medical history of prostate cancer and substantial tobacco abuse times 40 years with ongoing smoking. He presented via EMS as a code STEMI. He had retrosternal chest pain associated with diaphoresis, shortness of breath with radiation to the shoulders. The pain was described as 8 over 10 in severity. Cardiology consultation was obtained with Dr. Terrence Dupont  took him emergently to the Cath Lab. He was found to have severe multivessel coronary artery disease as described in the report. The circumflex is felt to be the probable culprit lesion. Peak troponin so far 0.24. He has a significant family history of coronary disease.   Patient noted taht pain stopped in EMS on way  to ER, Currently he is in CCU without chest pain   Signed: Robbyn Hodkinson E 10/24/2016, 8:05 AM

## 2016-10-23 NOTE — Progress Notes (Signed)
CARDIAC REHAB PHASE I   PRE:  Rate/Rhythm: 102 ST  BP:  Sitting: 104/63        SaO2: 98 RA  MODE:  Ambulation: 650 ft   POST:  Rate/Rhythm: 120 ST  BP:  Sitting: 126/73         SaO2: 99 RA  Pt able to get out of bed independently. Pt ambulated 650 ft on RA, independent, steady gait, tolerated well with no complaints other than cough/mild DOE. Pt able to increase distance today. Reinforced sternal precautions, encouraged IS, additional ambulation x2 today. Pt to bed for EPW removal after walk, call bell within reach. Will follow.   [id number] Lenna Sciara, RN, BSN 10/23/2016 10:19 AM

## 2016-10-24 LAB — GLUCOSE, CAPILLARY: Glucose-Capillary: 97 mg/dL (ref 65–99)

## 2016-10-24 MED ORDER — METOPROLOL TARTRATE 25 MG PO TABS: 25.0000 mg | ORAL_TABLET | Freq: Two times a day (BID) | ORAL | 1 refills | Status: DC

## 2016-10-24 MED ORDER — ATORVASTATIN CALCIUM 80 MG PO TABS: 80.0000 mg | ORAL_TABLET | Freq: Every day | ORAL | 1 refills | Status: DC

## 2016-10-24 MED ORDER — POTASSIUM CHLORIDE CRYS ER 10 MEQ PO TBCR: 10.0000 meq | EXTENDED_RELEASE_TABLET | Freq: Every day | ORAL | 0 refills | Status: DC

## 2016-10-24 MED ORDER — FUROSEMIDE 20 MG PO TABS: 20.0000 mg | ORAL_TABLET | Freq: Every day | ORAL | 0 refills | Status: DC

## 2016-10-24 MED ORDER — FOLIC ACID 1 MG PO TABS: 1.0000 mg | ORAL_TABLET | Freq: Every day | ORAL | 1 refills | Status: DC

## 2016-10-24 MED ORDER — LISINOPRIL 2.5 MG PO TABS: 2.5000 mg | ORAL_TABLET | Freq: Every day | ORAL | 1 refills | Status: DC

## 2016-10-24 MED ORDER — OXYCODONE HCL 5 MG PO TABS: 5.0000 mg | ORAL_TABLET | Freq: Four times a day (QID) | ORAL | 0 refills | Status: DC | PRN

## 2016-10-24 NOTE — Progress Notes (Signed)
[address]       [place] [address]             [phone]      5 Days Post-Op Procedure(s) (LRB): CORONARY ARTERY BYPASS GRAFTING (CABG) times five using left internal mammary artery and right saphenous vein.  Left mammary artery to Left anterior descending coronary artery, saphenous vein to diagonal, sequential vein to obtuse marginal and distal circumflex arteries, and vein graft to distal right coronary artery. (N/A) TRANSESOPHAGEAL ECHOCARDIOGRAM (TEE) (N/A) Subjective: Feels well  Objective: Vital signs in last 24 hours: Temp:  [98.9 F (37.2 C)-99.6 F (37.6 C)] 99.1 F (37.3 C) (02/01 0512) Pulse Rate:  [81-110] 90 (02/01 0512) Cardiac Rhythm: Sinus tachycardia (01/31 1900) Resp:  [18-21] 18 (02/01 0512) BP: (104-126)/(51-76) 126/70 (02/01 0512) SpO2:  [96 %-99 %] 97 % (02/01 0512) Weight:  [125 lb 6.4 oz (56.9 kg)] 125 lb 6.4 oz (56.9 kg) (02/01 0512)  Hemodynamic parameters for last 24 hours:    Intake/Output from previous day: 01/31 0701 - 02/01 0700 In: 720 [P.O.:720] Out: -  Intake/Output this shift: No intake/output data recorded.  General appearance: alert, cooperative and no distress Heart: regular rate and rhythm Lungs: clear to auscultation bilaterally Abdomen: benign Extremities: minor edema Wound: incis healing well  Lab Results:  Recent Labs  10/22/16 0426 10/23/16 0346  WBC 8.7 9.5  HGB 8.4* 8.8*  HCT 24.7* 25.7*  PLT 113* 161   BMET:  Recent Labs  10/22/16 0426 10/23/16 0346  NA 137 138  K 4.1 3.9  CL 104 105  CO2 26 24  GLUCOSE 98 97  BUN 13 10  CREATININE 0.79 0.88  CALCIUM 8.1* 8.0*    PT/INR: No results for input(s): LABPROT, INR in the last 72 hours. ABG    Component Value Date/Time   PHART 7.364 10/20/2016 0454   HCO3 23.4 10/20/2016 0454   TCO2 23 10/20/2016 1618   ACIDBASEDEF 2.0 10/20/2016 0454   O2SAT 97.0 10/20/2016 0454   CBG (last 3)   Recent Labs  10/23/16 1628  10/23/16 2035 10/24/16 0508  GLUCAP 107* 115* 97    Meds Scheduled Meds: . acetaminophen  1,000 mg Oral Q6H   Or  . acetaminophen (TYLENOL) oral liquid 160 mg/5 mL  1,000 mg Per Tube Q6H  . aspirin EC  81 mg Oral Daily   Or  . aspirin  81 mg Per Tube Daily  . atorvastatin  80 mg Oral q1800  . bisacodyl  10 mg Oral Daily   Or  . bisacodyl  10 mg Rectal Daily  . docusate sodium  200 mg Oral Daily  . folic acid  1 mg Oral Daily  . furosemide  20 mg Oral Daily  . insulin aspart  0-24 Units Subcutaneous Q4H  . levalbuterol  0.63 mg Nebulization BID  . lisinopril  2.5 mg Oral Daily  . metoprolol tartrate  25 mg Oral BID   Or  . metoprolol tartrate  12.5 mg Per Tube BID  . mupirocin ointment  1 application Nasal BID  . pantoprazole  40 mg Oral Daily  . potassium chloride  10 mEq Oral Daily  . sodium chloride flush  3 mL Intravenous Q12H   Continuous Infusions: PRN Meds:.sodium chloride, metoprolol, ondansetron (ZOFRAN) IV, oxyCODONE, phenol, sodium chloride flush, traMADol  Xrays Dg Chest 2 View  Result Date: 10/23/2016 CLINICAL DATA:  CABG. EXAM: CHEST  2 VIEW COMPARISON:  10/22/2016. FINDINGS: Interim removal  right IJ sheath. Prior CABG. Heart size normal. No focal infiltrate. Mild basilar atelectasis. Small bilateral pleural effusions. No pneumothorax. IMPRESSION: 1.  Interim removal right IJ sheath. 2.  Prior CABG.  Heart size normal. 3. Mild basilar subsegmental atelectasis. Small bilateral pleural effusions. Electronically Signed   By: Marcello Moores  Register   On: 10/23/2016 07:23    Assessment/Plan: S/P Procedure(s) (LRB): CORONARY ARTERY BYPASS GRAFTING (CABG) times five using left internal mammary artery and right saphenous vein.  Left mammary artery to Left anterior descending coronary artery, saphenous vein to diagonal, sequential vein to obtuse marginal and distal circumflex arteries, and vein graft to distal right coronary artery. (N/A) TRANSESOPHAGEAL ECHOCARDIOGRAM (TEE)  (N/A) Plan for discharge: see discharge orders   LOS: 6 days    GOLD,WAYNE E 10/24/2016

## 2016-10-24 NOTE — Care Management Note (Signed)
Case Management Note Marvetta Gibbons RN, BSN Unit 2W-Case Manager [phone]  Patient Details  Name: Noah Cochran MRN: [id number] Date of Birth: Mar 22, 1951  Subjective/Objective:   Pt admitted with acute MI- s/p CABG x5- on 10/19/16- pt tx from ICU to 2W on  10/22/16               Action/Plan: PTA pt lived at home alone- plant to return home- pt will have 24/7 assistance- pt's mother plans to move in and provide needed care. - No CM needs noted for discharge.   Expected Discharge Date:  10/24/16               Expected Discharge Plan:  Home/Self Care  In-House Referral:     Discharge planning Services  CM Consult  Post Acute Care Choice:    Choice offered to:     DME Arranged:    DME Agency:     HH Arranged:    HH Agency:     Status of Service:  Completed, signed off  If discussed at [employer] of Stay Meetings, dates discussed:    Additional Comments:  Dawayne Patricia, RN 10/24/2016, 11:25 AM

## 2016-10-24 NOTE — Progress Notes (Signed)
CARDIAC REHAB PHASE I   Pt has ambulated independently this morning with no complaints, declines additional ambulation at this time. Cardiac surgery discharge education completed. Reviewed risk factors, tobacco cessation, IS, sternal precautions, activity progression, exercise, risk factors, heart healthy diet, daily weights and phase 2 cardiac rehab. Pt verbalized understanding, receptive to education.  Pt agrees to phase 2 cardiac rehab referral, will send to [facility] per pt request. Pt up ad lib in room, awaiting discharge.  [id number] Lenna Sciara, RN, BSN 10/24/2016 11:12 AM

## 2016-10-24 NOTE — Progress Notes (Signed)
Pt refused his midnight cbg draw. Pt asked to sleep instead. Will continue to follow.

## 2016-10-24 NOTE — Progress Notes (Signed)
CT sutures removed per order and per protocol. Pt tolerated well. Sites painted with tincture steri-strips applied. Pt educated not to remove steri-strips as they will come off on their own. Call bell and phone within reach. Will continue to monitor.

## 2016-10-29 ENCOUNTER — Telehealth: Payer: Self-pay | Admitting: Surgical

## 2016-10-29 NOTE — Telephone Encounter (Signed)
      [address]       [place] [address]             [phone]          [address]       [place] [address]             [place] [id number]   S/P DATE OF PROCEDURE:  10/19/2016 DATE OF DISCHARGE: 10/24/2016                              OPERATIVE REPORT   PREOPERATIVE DIAGNOSIS:  Coronary occlusive disease presenting with ST- elevation myocardial infarction.  POSTOPERATIVE DIAGNOSIS:  Coronary occlusive disease presenting with ST- elevation myocardial infarction.  SURGICAL PROCEDURE:  Emergent coronary artery bypass grafting x5 with the left internal mammary to the left anterior descending coronary artery, reverse saphenous vein graft to the diagonal coronary artery, sequential reverse saphenous vein graft to the first obtuse marginal and distal circumflex, reverse saphenous vein graft to the distal right coronary artery with right thigh and calf, greater saphenous vein endoscopic harvesting, placement of femoral arterial, ultrasound-guided.  SURGEON:  Lanelle Bal, MD  FIRST ASSISTANT:  John Giovanni, PA-C Medications: Allergies as of 10/29/2016      Reactions   Bee Venom Anaphylaxis      Medication List       Accurate as of 10/29/16  2:02 PM. Always use your most recent med list.          aspirin EC 81 MG tablet Take 81 mg by mouth daily.   atorvastatin 80 MG tablet Commonly known as:  LIPITOR Take 1 tablet (80 mg total) by mouth daily at 6 PM.   EPINEPHrine 0.3 mg/0.3 mL Soaj injection Commonly known as:  EPIPEN 2-PAK Inject 0.3 mLs (0.3 mg total) into the muscle once.   folic acid 1 MG tablet Commonly known as:  FOLVITE Take 1 tablet (1 mg total) by mouth daily.   furosemide 20 MG tablet Commonly known as:  LASIX Take 1 tablet (20 mg total) by mouth daily.   lisinopril 2.5 MG tablet Commonly known as:  PRINIVIL,ZESTRIL Take 1 tablet (2.5 mg total) by mouth daily.     metoprolol tartrate 25 MG tablet Commonly known as:  LOPRESSOR Take 1 tablet (25 mg total) by mouth 2 (two) times daily.   oxyCODONE 5 MG immediate release tablet Commonly known as:  Oxy IR/ROXICODONE Take 1-2 tablets (5-10 mg total) by mouth every 6 (six) hours as needed for severe pain.   potassium chloride 10 MEQ tablet Commonly known as:  K-DUR,KLOR-CON Take 1 tablet (10 mEq total) by mouth daily.       Coumadin: not required  Problems/Concerns: none  Assessment:  Patient is doing very well with only mild soreness in right ankle region when he walks..   Further instructions  provided.  Contact office if concerns or problems develop  Follow up Appointment: scheduled

## 2016-11-21 ENCOUNTER — Other Ambulatory Visit: Payer: Self-pay | Admitting: Cardiothoracic Surgery

## 2016-11-21 DIAGNOSIS — Z951 Presence of aortocoronary bypass graft: Secondary | ICD-10-CM

## 2016-11-25 ENCOUNTER — Ambulatory Visit
Admission: RE | Admit: 2016-11-25 | Discharge: 2016-11-25 | Disposition: A | Discharge status: Home / Self Care | Payer: BLUE CROSS/BLUE SHIELD | Source: Ambulatory Visit | Attending: Cardiothoracic Surgery | Admitting: Cardiothoracic Surgery

## 2016-11-25 ENCOUNTER — Ambulatory Visit (INDEPENDENT_AMBULATORY_CARE_PROVIDER_SITE_OTHER): Payer: Self-pay | Admitting: Surgical

## 2016-11-25 VITALS — BP 96/60 | HR 72 | Resp 16 | Ht 66.0 in | Wt 115.2 lb

## 2016-11-25 DIAGNOSIS — Z951 Presence of aortocoronary bypass graft: Secondary | ICD-10-CM

## 2016-11-25 DIAGNOSIS — I2510 Atherosclerotic heart disease of native coronary artery without angina pectoris: Secondary | ICD-10-CM

## 2016-11-25 NOTE — Progress Notes (Signed)
Noah Cochran,Noah Cochran [place] Medical Record [id number] Date of Birth: 1951-06-11  Referring [id number], Noah Burly, MD Primary Cardiology: Primary Care:[name], MD  Chief Complaint:  Follow Up Visit DATE OF PROCEDURE:  10/19/2016 DATE OF DISCHARGE:                              OPERATIVE REPORT   PREOPERATIVE DIAGNOSIS:  Coronary occlusive disease presenting with ST- elevation myocardial infarction.  POSTOPERATIVE DIAGNOSIS:  Coronary occlusive disease presenting with ST- elevation myocardial infarction.  SURGICAL PROCEDURE:  Emergent coronary artery bypass grafting x5 with the left internal mammary to the left anterior descending coronary artery, reverse saphenous vein graft to the diagonal coronary artery, sequential reverse saphenous vein graft to the first obtuse marginal and distal circumflex, reverse saphenous vein graft to the distal right coronary artery with right thigh and calf, greater saphenous vein endoscopic harvesting, placement of femoral arterial, ultrasound-guided.  SURGEON:  Lanelle Bal, MD  FIRST ASSISTANT:  John Giovanni, PA-C History of Present Illness:      Patient is a 66 year old male seen in office in routine follow-up after emergency CABG as described above by Dr. Servando Snare. He reports that he is feeling well. He is no longer using any pain medication. He has not smoked cigarettes since hospitalization. He denies fevers, chills or other constitutional symptoms. He has had no difficulties with his incision although the [facility] site is a little bit tender and numb-near the ankle.    Zubrod Score: At the time of surgery this patient's most appropriate activity status/level should be described as: []     0    Normal activity, no symptoms []     1    Restricted in physical strenuous activity but ambulatory, able to do out light work []     2     Ambulatory and capable of self care, unable to do work activities, up and about                 >50 % of waking hours                                                                                   []     3    Only limited self care, in bed greater than 50% of waking hours []     4    Completely disabled, no self care, confined to bed or chair []     5    Moribund  History  Smoking Status  . Current Every Day Smoker  . Packs/day: 1.00  . Years: 40.00  . Types: Cigarettes  Smokeless Tobacco  . Never Used    Comment: 1 ppd        Allergies  Allergen Reactions  . Bee Venom Anaphylaxis    Current Outpatient Prescriptions  Medication Sig Dispense Refill  . aspirin EC 81  MG tablet Take 81 mg by mouth daily.    Marland Kitchen atorvastatin (LIPITOR) 80 MG tablet Take 1 tablet (80 mg total) by mouth daily at 6 PM. 30 tablet 1  . EPINEPHrine (EPIPEN 2-PAK) 0.3 mg/0.3 mL IJ SOAJ injection Inject 0.3 mLs (0.3 mg total) into the muscle once. 1 Device 2  . folic acid (FOLVITE) 1 MG tablet Take 1 tablet (1 mg total) by mouth daily. 30 tablet 1  . lisinopril (PRINIVIL,ZESTRIL) 2.5 MG tablet Take 1 tablet (2.5 mg total) by mouth daily. 30 tablet 1  . metoprolol tartrate (LOPRESSOR) 25 MG tablet Take 1 tablet (25 mg total) by mouth 2 (two) times daily. 60 tablet 1   No current facility-administered medications for this visit.        Physical Exam: BP 96/60 (BP Location: Left Arm, Patient Position: Sitting, Cuff Size: Normal)   Pulse 72   Resp 16   Ht 5\' 6"  (1.676 m)   Wt 115 lb 3.2 oz (52.3 kg)   SpO2 98% Comment: ON RA  BMI 18.59 kg/m   General appearance: alert, cooperative and no distress Heart: regular rate and rhythm Lungs: clear to auscultation bilaterally Abdomen: benign Extremities: no edema Wounds: Incisions are all healing well without evidence of infection.  Diagnostic Studies & Laboratory data:         Recent Radiology Findings: Dg Chest 2 View  Result Date: 11/25/2016 CLINICAL  DATA:  Status post CABG on October 19, 2016 EXAM: CHEST  2 VIEW COMPARISON:  New PA and lateral chest x-ray of February 31, 2018 FINDINGS: The lungs are mildly hyperinflated. A tiny amount of pleural fluid blunts the costophrenic angles. There is no alveolar infiltrate or pleural effusion. The heart and pulmonary vascularity are normal. The mediastinum is normal in width. The sternal wires are intact. The retrosternal soft tissues are normal. The observed bony thorax exhibits no acute abnormality. IMPRESSION: Mild hyperinflation consistent with COPD. Interval further decrease in size of bilateral pleural effusions such that only a trace of fluid is now present. No pulmonary edema. Electronically Signed   By: David  Martinique M.D.   On: 11/25/2016 13:16      I have independently reviewed the above radiology findings and reviewed findings  with the patient.  Recent Labs: Lab Results  Component Value Date   WBC 9.5 10/23/2016   HGB 8.8 (L) 10/23/2016   HCT 25.7 (L) 10/23/2016   PLT 161 10/23/2016   GLUCOSE 97 10/23/2016   CHOL 190 10/19/2016   TRIG 76 10/19/2016   HDL 60 10/19/2016   LDLCALC 115 (H) 10/19/2016   ALT 17 10/18/2016   AST 27 10/18/2016   NA 138 10/23/2016   K 3.9 10/23/2016   CL 105 10/23/2016   CREATININE 0.88 10/23/2016   BUN 10 10/23/2016   CO2 24 10/23/2016   TSH 1.07 03/07/2015   INR 1.19 10/20/2016   HGBA1C 5.1 10/18/2016      Assessment / Plan:  Patient continues to do quite well. He has seen Dr. Truddie Crumble in cardiology follow-up. He is not currently having any surgically related issues. We discussed long-term activity progression including driving as well as lifting instructions. He has not yet started cardiac rehabilitation . He asked about work however he works in [employer] and is labor-intensive including heavy lifting and climbing so I informed him not to use that for an additional 2 months. We will see him again on a when necessary basis for any surgery related  issues and at  request.       [name] E 11/25/2016 2:05 PM

## 2016-11-25 NOTE — Patient Instructions (Signed)
Patient given verbal instructions regarding activity progression as well as restrictions. Also given instructions on routine driving progression protocols. He verbalizes understanding of these instructions.

## 2016-11-29 ENCOUNTER — Telehealth (HOSPITAL_COMMUNITY): Payer: Self-pay | Admitting: Internal Medicine

## 2016-11-29 NOTE — Telephone Encounter (Signed)
Verified BCBS insurance benefits through [employer] No Co-Pay, Co-Insurance 30% Deductible $800.00  pt has met all Out of Pocket $2450.00 pt has met all Reference [phone]... KJ

## 2016-12-03 ENCOUNTER — Telehealth (HOSPITAL_COMMUNITY): Payer: Self-pay | Admitting: Internal Medicine

## 2016-12-03 NOTE — Telephone Encounter (Signed)
Left message on patient voicemail to call office if still interested in the Cardiac Rehab program.

## 2016-12-05 ENCOUNTER — Encounter (INDEPENDENT_AMBULATORY_CARE_PROVIDER_SITE_OTHER): Payer: Self-pay

## 2016-12-17 ENCOUNTER — Encounter (HOSPITAL_COMMUNITY): Payer: Self-pay

## 2017-01-02 ENCOUNTER — Ambulatory Visit (HOSPITAL_COMMUNITY): Payer: Self-pay

## 2017-01-03 ENCOUNTER — Telehealth (HOSPITAL_COMMUNITY): Payer: Self-pay | Admitting: *Deleted

## 2017-01-07 ENCOUNTER — Encounter (HOSPITAL_COMMUNITY): Payer: Self-pay

## 2017-01-07 ENCOUNTER — Encounter (HOSPITAL_COMMUNITY)
Admission: RE | Admit: 2017-01-07 | Discharge: 2017-01-07 | Disposition: A | Discharge status: Home / Self Care | Payer: BLUE CROSS/BLUE SHIELD | Source: Ambulatory Visit | Attending: Cardiology | Admitting: Cardiology

## 2017-01-07 VITALS — BP 104/70 | HR 73 | Ht 64.0 in | Wt 122.6 lb

## 2017-01-07 DIAGNOSIS — Z951 Presence of aortocoronary bypass graft: Secondary | ICD-10-CM

## 2017-01-07 DIAGNOSIS — I2119 ST elevation (STEMI) myocardial infarction involving other coronary artery of inferior wall: Secondary | ICD-10-CM

## 2017-01-07 NOTE — Progress Notes (Signed)
Cardiac Rehab Medication Review by a Pharmacist  Does the patient  feel that his/her medications are working for him/her? Yes   Has the patient been experiencing any side effects to the medications prescribed? No   Does the patient measure his/her own blood pressure or blood glucose at home?  No   Does the patient have any problems obtaining medications due to transportation or finances?   No   Understanding of regimen: Good  Understanding of indications: Poor  Potential of compliance: Good   Pharmacist comments:   Patient presents ambulating unassisted and in good spirits today. Medication indications, dosing, frequency, and notable side effects reviewed with patient. Patient verbalized concern regarding the indications for his medications. We talked about each medication individually, discussing indications and potential side effects. Patient reports no issues with blood pressure and does not monitor her BP regularly. I suggested he have it checked at the pharmacy if he is ever worried about it being too low (evidenced by lightheaded/dizziness). The patient voiced concerns about a lapse in his medications a few weeks ago due to no refills remaining. He reports speaking with cardiac rehab recently, who encouraged him to refill medications after speaking with his provider. The patient reports today that he has his medications now and knows to take them as directed. Time offered for discussion with questions. No further questions or concerns at conclusion of our visit.    Argie Ramming, PharmD Pharmacy Resident  Pager [phone] 11/05/16 8:02 AM

## 2017-01-13 ENCOUNTER — Encounter (HOSPITAL_COMMUNITY)
Admission: RE | Admit: 2017-01-13 | Discharge: 2017-01-13 | Disposition: A | Discharge status: Home / Self Care | Payer: BLUE CROSS/BLUE SHIELD | Source: Ambulatory Visit | Attending: Cardiology | Admitting: Cardiology

## 2017-01-13 ENCOUNTER — Encounter (HOSPITAL_COMMUNITY): Payer: Self-pay

## 2017-01-13 DIAGNOSIS — I2119 ST elevation (STEMI) myocardial infarction involving other coronary artery of inferior wall: Secondary | ICD-10-CM

## 2017-01-13 DIAGNOSIS — Z951 Presence of aortocoronary bypass graft: Secondary | ICD-10-CM

## 2017-01-13 NOTE — Progress Notes (Signed)
Daily Session Note  Patient Details  Name: Noah Cochran MRN: [id number] Date of Birth: 09-25-1950 Referring Provider:     CARDIAC REHAB PHASE II ORIENTATION from 01/07/2017 in [place]  Referring Provider  Charolette Forward, MD      Encounter Date: 01/13/2017  Check In:     Session Check In - 01/13/17 0826      Check-In   Location [facility]   Staff Present Su Hilt, MS, ACSM RCEP, Exercise Physiologist;Amber Fair, MS, ACSM RCEP, Exercise Physiologist;Jasmine Maceachern, RN, BSN   Supervising physician immediately available to respond to emergencies [facility] immediately available   Physician(s) Dr. Candiss Norse   Medication changes reported     No   Fall or balance concerns reported    No   Tobacco Cessation No Change   Warm-up and Cool-down Performed as group-led instruction   Resistance Training Performed Yes   VAD Patient? No     Pain Assessment   Currently in Pain? No/denies   Multiple Pain Sites No      Capillary Blood Glucose: No results found for this or any previous visit (from the past 24 hour(s)).      Exercise Prescription Changes - 01/13/17 1600      Response to Exercise   Blood Pressure (Admit) 108/70   Blood Pressure (Exercise) 114/70   Blood Pressure (Exit) 110/72   Heart Rate (Admit) 79 bpm   Heart Rate (Exercise) 92 bpm   Heart Rate (Exit) 75 bpm   Rating of Perceived Exertion (Exercise) 12   Duration Continue with 30 min of aerobic exercise without signs/symptoms of physical distress.   Intensity THRR unchanged     Progression   Progression Continue to progress workloads to maintain intensity without signs/symptoms of physical distress.   Average METs 2.9     Resistance Training   Training Prescription Yes   Weight 3lb   Reps 10-15   Time 10 Minutes     Bike   Level 0.7   Minutes 10   METs 3.38     NuStep   Level 3   SPM 85   Minutes 10   METs 2.4     Track   Laps 12   Minutes 10   METs 3.09      History  Smoking Status  . Former Smoker  . Packs/day: 1.00  . Years: 40.00  . Types: Cigarettes  . Quit date: 10/12/2016  Smokeless Tobacco  . Never Used    Comment: 1 ppd     Goals Met:  Exercise tolerated well  Goals Unmet:  Not Applicable  Comments: Pt started cardiac rehab today.  Pt tolerated light exercise without difficulty. VSS, telemetry-sinus rhythm,  asymptomatic.  Medication list reconciled. Pt denies barriers to medicaiton compliance.  PSYCHOSOCIAL ASSESSMENT:  PHQ-0.  Pt exhibits positive coping skills, hopeful outlook with supportive family. No psychosocial needs identified at this time, no psychosocial interventions necessary.    Pt enjoys being outdoors and playing church softball.  Pt goals for cardiac rehab are to be able to return to his previous activities without difficulty.     Pt oriented to exercise equipment and routine.    Understanding verbalized.   Dr. Fransico Him is Medical Director for Cardiac Rehab at [facility].

## 2017-01-15 ENCOUNTER — Encounter (HOSPITAL_COMMUNITY)
Admission: RE | Admit: 2017-01-15 | Discharge: 2017-01-15 | Disposition: A | Discharge status: Home / Self Care | Payer: BLUE CROSS/BLUE SHIELD | Source: Ambulatory Visit | Attending: Cardiology | Admitting: Cardiology

## 2017-01-15 DIAGNOSIS — Z951 Presence of aortocoronary bypass graft: Secondary | ICD-10-CM

## 2017-01-15 DIAGNOSIS — I2119 ST elevation (STEMI) myocardial infarction involving other coronary artery of inferior wall: Secondary | ICD-10-CM

## 2017-01-15 NOTE — Progress Notes (Signed)
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  aspirin EC 81 MG tablet, Take 81 mg by mouth daily., Disp: , Rfl:  .  atorvastatin (LIPITOR) 80 MG tablet, Take 1 tablet (80 mg total) by mouth daily at 6 PM., Disp: 30 tablet, Rfl: 1 .  EPINEPHrine (EPIPEN 2-PAK) 0.3 mg/0.3 mL IJ SOAJ injection, Inject 0.3 mLs (0.3 mg total) into the muscle once., Disp: 1 Device, Rfl: 2 .  folic acid (FOLVITE) 1 MG tablet, Take 1 tablet (1 mg total) by mouth daily., Disp: 30 tablet, Rfl: 1 .  lisinopril (PRINIVIL,ZESTRIL) 2.5 MG tablet, Take 1 tablet (2.5 mg total) by mouth daily., Disp: 30 tablet, Rfl: 1 .  metoprolol tartrate (LOPRESSOR) 25 MG tablet, Take 1 tablet (25 mg total) by mouth 2 (two) times daily., Disp: 60 tablet, Rfl: 1 .  NITROGLYCERIN SL, Place 1 tablet under the tongue daily as needed., Disp: , Rfl:   Past Medical History: Past Medical History:  Diagnosis Date  . 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 Coronary artery disease   . Elevated prostate specific antigen (PSA)   . History of chicken pox   . 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 Prostate cancer ([place])     Tobacco Use: History  Smoking Status  . Former Smoker  . Packs/day: 1.00  . Years: 40.00  . Types: Cigarettes  . Quit date: 10/12/2016  Smokeless Tobacco  . Never Used     Comment: 1 ppd     Labs: Recent Review Flowsheet Data    Labs for ITP Cardiac and Pulmonary Rehab Latest Ref Rng & Units 10/19/2016 10/19/2016 10/20/2016 10/20/2016 10/20/2016   Cholestrol 0 - 200 mg/dL - - - - -   LDLCALC 0 - 99 mg/dL - - - - -   HDL >40 mg/dL - - - - -   Trlycerides <150 mg/dL - - - - -   Hemoglobin A1c 4.8 - 5.6 % - - - - -   PHART 7.350 - 7.450 7.337(L) 7.342(L) 7.403 7.364 -   PCO2ART 32.0 - 48.0 mmHg 45.2 46.0 36.6 41.3 -   HCO3 20.0 - 28.0 mmol/L 24.2 24.9 22.6 23.4 -   TCO2 0 - 100 mmol/L 26 26 24 25 23    ACIDBASEDEF 0.0 - 2.0 mmol/L 2.0 1.0 2.0 2.0 -   O2SAT % 100.0 100.0 98.0 97.0 -      Capillary Blood Glucose: Lab Results  Component Value Date   GLUCAP 97 10/24/2016   GLUCAP 115 (H) 10/23/2016   GLUCAP 107 (H) 10/23/2016   GLUCAP 114 (H) 10/23/2016   GLUCAP 98 10/23/2016     Exercise Target Goals:    Exercise Program Goal: Individual exercise prescription set with THRR, safety & activity barriers. Participant demonstrates ability to understand and report RPE using BORG scale, to self-measure  pulse accurately, and to acknowledge the importance of the exercise prescription.  Exercise Prescription Goal: Starting with aerobic activity 30 plus minutes a day, 3 days per week for initial exercise prescription. Provide home exercise prescription and guidelines that participant acknowledges understanding prior to discharge.  Activity Barriers & Risk Stratification:     Activity Barriers & Cardiac Risk Stratification - 01/07/17 0836      Activity Barriers & Cardiac Risk Stratification   Activity Barriers Other (comment)   Comments L shoulder [facility] ~25years ago   Cardiac Risk Stratification High      6 Minute Walk:     6 Minute Walk    Row Name 01/07/17 1114         6 Minute Walk   Phase Initial     Distance 1411 feet     Walk Time 6 minutes     # of Rest Breaks 0     MPH 2.67     METS 3.48     RPE 11     VO2 Peak 12.18      Symptoms No     Resting HR 73 bpm     Resting BP 104/70     Max Ex. HR 93 bpm     Max Ex. BP 108/70     2 Minute Post BP 106/70        Oxygen Initial Assessment:   Oxygen Re-Evaluation:   Oxygen Discharge (Final Oxygen Re-Evaluation):   Initial Exercise Prescription:     Initial Exercise Prescription - 01/07/17 1200      Date of Initial Exercise RX and Referring Provider   Date 01/07/17   Referring Provider Charolette Forward, MD     Bike   Level 0.7   Minutes 10   METs 3.4     NuStep   Level 3   SPM 85   Minutes 10   METs 3     Track   Laps 12   Minutes 10   METs 3.09     Prescription Details   Frequency (times per week) 3   Duration Progress to 30 minutes of continuous aerobic without signs/symptoms of physical distress     Intensity   THRR 40-80% of Max Heartrate 62-125   Ratings of Perceived Exertion 11-13   Perceived Dyspnea 0-4     Progression   Progression Continue to progress workloads to maintain intensity without signs/symptoms of physical distress.     Resistance Training   Training Prescription Yes   Weight 3lb   Reps 10-15      Perform Capillary Blood Glucose checks as needed.  Exercise Prescription Changes:     Exercise Prescription Changes    Row Name 01/13/17 1600             Response to Exercise   Blood Pressure (Admit) 108/70       Blood Pressure (Exercise) 114/70       Blood Pressure (Exit) 110/72       Heart Rate (Admit) 79 bpm       Heart Rate (Exercise) 92 bpm       Heart Rate (Exit) 75 bpm       Rating of Perceived Exertion (Exercise) 12       Duration Continue with 30 min of aerobic exercise without signs/symptoms of physical distress.       Intensity THRR unchanged         Progression   Progression Continue to progress workloads to maintain intensity  without signs/symptoms of physical distress.       Average METs 2.9         Resistance Training   Training Prescription Yes       Weight 3lb       Reps  10-15       Time 10 Minutes         Bike   Level 0.7       Minutes 10       METs 3.38         NuStep   Level 3       SPM 85       Minutes 10       METs 2.4         Track   Laps 12       Minutes 10       METs 3.09          Exercise Comments:     Exercise Comments    Row Name 01/14/17 1420           Exercise Comments Pt responded to first exercise session very well. Pt exercise for 30 minutes without s/s of CP, dizziness or SOB.           Exercise Goals and Review:     Exercise Goals    Row Name 01/07/17 [phone]             Exercise Goals   Increase Physical Activity Yes       Intervention Provide advice, education, support and counseling about physical activity/exercise needs.;Develop an individualized exercise prescription for aerobic and resistive training based on initial evaluation findings, risk stratification, comorbidities and participant's personal goals.       Expected Outcomes Achievement of increased cardiorespiratory fitness and enhanced flexibility, muscular endurance and strength shown through measurements of functional capacity and personal statement of participant.       Increase Strength and Stamina Yes  Return to normal activities       Intervention Develop an individualized exercise prescription for aerobic and resistive training based on initial evaluation findings, risk stratification, comorbidities and participant's personal goals.;Provide advice, education, support and counseling about physical activity/exercise needs.       Expected Outcomes Achievement of increased cardiorespiratory fitness and enhanced flexibility, muscular endurance and strength shown through measurements of functional capacity and personal statement of participant.          Exercise Goals Re-Evaluation :    Discharge Exercise Prescription (Final Exercise Prescription Changes):     Exercise Prescription Changes - 01/13/17 1600      Response to Exercise   Blood  Pressure (Admit) 108/70   Blood Pressure (Exercise) 114/70   Blood Pressure (Exit) 110/72   Heart Rate (Admit) 79 bpm   Heart Rate (Exercise) 92 bpm   Heart Rate (Exit) 75 bpm   Rating of Perceived Exertion (Exercise) 12   Duration Continue with 30 min of aerobic exercise without signs/symptoms of physical distress.   Intensity THRR unchanged     Progression   Progression Continue to progress workloads to maintain intensity without signs/symptoms of physical distress.   Average METs 2.9     Resistance Training   Training Prescription Yes   Weight 3lb   Reps 10-15   Time 10 Minutes     Bike   Level 0.7   Minutes 10   METs 3.38     NuStep   Level 3   SPM 85   Minutes 10  METs 2.4     Track   Laps 12   Minutes 10   METs 3.09      Nutrition:  Target Goals: Understanding of nutrition guidelines, daily intake of sodium 1500mg , cholesterol 200mg , calories 30% from fat and 7% or less from saturated fats, daily to have 5 or more servings of fruits and vegetables.  Biometrics:     Pre Biometrics - 01/07/17 1113      Pre Biometrics   Height 5\' 4"  (1.626 m)   Weight 122 lb 9.2 oz (55.6 kg)   Waist Circumference 32 inches   Hip Circumference 34 inches   Waist to Hip Ratio 0.94 %   BMI (Calculated) 21.1   Triceps Skinfold 8 mm   % Body Fat 19.1 %   Grip Strength 23 kg   Flexibility 12.5 in   Single Leg Stand 20.87 seconds       Nutrition Therapy Plan and Nutrition Goals:   Nutrition Discharge: Nutrition Scores:   Nutrition Goals Re-Evaluation:   Nutrition Goals Re-Evaluation:   Nutrition Goals Discharge (Final Nutrition Goals Re-Evaluation):   Psychosocial: Target Goals: Acknowledge presence or absence of significant depression and/or stress, maximize coping skills, provide positive support system. Participant is able to verbalize types and ability to use techniques and skills needed for reducing stress and depression.  Initial Review &  Psychosocial Screening:     Initial Psych Review & Screening - 01/07/17 1112      Initial Review   Current issues with None Identified     Family Dynamics   Good Support System? Yes  family, church      Barriers   Psychosocial barriers to participate in program There are no identifiable barriers or psychosocial needs.     Screening Interventions   Interventions Encouraged to exercise;Provide feedback about the scores to participant      Quality of Life Scores:     Quality of Life - 01/07/17 1037      Quality of Life Scores   Health/Function Pre 25.77 %   Socioeconomic Pre 24.29 %   Psych/Spiritual Pre 28.57 %   Family Pre 30 %   GLOBAL Pre 26.56 %      PHQ-9: Recent Review Flowsheet Data    Depression screen [facility] 2/9 01/13/2017 02/29/2016   Decreased Interest 0 0   Down, Depressed, Hopeless 0 0   PHQ - 2 Score 0 0     Interpretation of Total Score  Total Score Depression Severity:  1-4 = Minimal depression, 5-9 = Mild depression, 10-14 = Moderate depression, 15-19 = Moderately severe depression, 20-27 = Severe depression   Psychosocial Evaluation and Intervention:     Psychosocial Evaluation - 01/13/17 1650      Psychosocial Evaluation & Interventions   Interventions Encouraged to exercise with the program and follow exercise prescription   Comments no psychosocial needs identified, no interventions necessary    Expected Outcomes pt will exhibit positive outlook with good coping skills.    Continue Psychosocial Services  No Follow up required      Psychosocial Re-Evaluation:   Psychosocial Discharge (Final Psychosocial Re-Evaluation):   Vocational Rehabilitation: Provide vocational rehab assistance to qualifying candidates.   Vocational Rehab Evaluation & Intervention:     Vocational Rehab - 01/07/17 1112      Initial Vocational Rehab Evaluation & Intervention   Assessment shows need for Vocational Rehabilitation No      Education: Education  Goals: Education classes will be provided on a weekly basis,  covering required topics. Participant will state understanding/return demonstration of topics presented.  Learning Barriers/Preferences:     Learning Barriers/Preferences - 01/07/17 0836      Learning Barriers/Preferences   Learning Barriers None   Learning Preferences Written Material;Skilled Demonstration      Education Topics: Count Your Pulse:  -Group instruction provided by verbal instruction, demonstration, patient participation and written materials to support subject.  Instructors address importance of being able to find your pulse and how to count your pulse when at home without a heart monitor.  Patients get hands on experience counting their pulse with staff help and individually.   Heart Attack, Angina, and Risk Factor Modification:  -Group instruction provided by verbal instruction, video, and written materials to support subject.  Instructors address signs and symptoms of angina and heart attacks.    Also discuss risk factors for heart disease and how to make changes to improve heart health risk factors.   Functional Fitness:  -Group instruction provided by verbal instruction, demonstration, patient participation, and written materials to support subject.  Instructors address safety measures for doing things around the house.  Discuss how to get up and down off the floor, how to pick things up properly, how to safely get out of a chair without assistance, and balance training.   Meditation and Mindfulness:  -Group instruction provided by verbal instruction, patient participation, and written materials to support subject.  Instructor addresses importance of mindfulness and meditation practice to help reduce stress and improve awareness.  Instructor also leads participants through a meditation exercise.    Stretching for Flexibility and Mobility:  -Group instruction provided by verbal instruction, patient  participation, and written materials to support subject.  Instructors lead participants through series of stretches that are designed to increase flexibility thus improving mobility.  These stretches are additional exercise for major muscle groups that are typically performed during regular warm up and cool down.   Hands Only CPR Anytime:  -Group instruction provided by verbal instruction, video, patient participation and written materials to support subject.  Instructors co-teach with AHA video for hands only CPR.  Participants get hands on experience with mannequins.   Nutrition I class: Heart Healthy Eating:  -Group instruction provided by PowerPoint slides, verbal discussion, and written materials to support subject matter. The instructor gives an explanation and review of the Therapeutic Lifestyle Changes diet recommendations, which includes a discussion on lipid goals, dietary fat, sodium, fiber, plant stanol/sterol esters, sugar, and the components of a well-balanced, healthy diet.   Nutrition II class: Lifestyle Skills:  -Group instruction provided by PowerPoint slides, verbal discussion, and written materials to support subject matter. The instructor gives an explanation and review of label reading, grocery shopping for heart health, heart healthy recipe modifications, and ways to make healthier choices when eating out.   Diabetes Question & Answer:  -Group instruction provided by PowerPoint slides, verbal discussion, and written materials to support subject matter. The instructor gives an explanation and review of diabetes co-morbidities, pre- and post-prandial blood glucose goals, pre-exercise blood glucose goals, signs, symptoms, and treatment of hypoglycemia and hyperglycemia, and foot care basics.   Diabetes Blitz:  -Group instruction provided by PowerPoint slides, verbal discussion, and written materials to support subject matter. The instructor gives an explanation and review of  the physiology behind type 1 and type 2 diabetes, diabetes medications and rational behind using different medications, pre- and post-prandial blood glucose recommendations and Hemoglobin A1c goals, diabetes diet, and exercise including blood glucose guidelines for  exercising safely.    Portion Distortion:  -Group instruction provided by PowerPoint slides, verbal discussion, written materials, and food models to support subject matter. The instructor gives an explanation of serving size versus portion size, changes in portions sizes over the last 20 years, and what consists of a serving from each food group.   Stress Management:  -Group instruction provided by verbal instruction, video, and written materials to support subject matter.  Instructors review role of stress in heart disease and how to cope with stress positively.     Exercising on Your Own:  -Group instruction provided by verbal instruction, power point, and written materials to support subject.  Instructors discuss benefits of exercise, components of exercise, frequency and intensity of exercise, and end points for exercise.  Also discuss use of nitroglycerin and activating EMS.  Review options of places to exercise outside of rehab.  Review guidelines for sex with heart disease.   Cardiac Drugs I:  -Group instruction provided by verbal instruction and written materials to support subject.  Instructor reviews cardiac drug classes: antiplatelets, anticoagulants, beta blockers, and statins.  Instructor discusses reasons, side effects, and lifestyle considerations for each drug class.   Cardiac Drugs II:  -Group instruction provided by verbal instruction and written materials to support subject.  Instructor reviews cardiac drug classes: angiotensin converting enzyme inhibitors (ACE-I), angiotensin II receptor blockers (ARBs), nitrates, and calcium channel blockers.  Instructor discusses reasons, side effects, and lifestyle  considerations for each drug class.   Anatomy and Physiology of the Circulatory System:  -Group instruction provided by verbal instruction, video, and written materials to support subject.  Reviews functional anatomy of heart, how it relates to various diagnoses, and what role the heart plays in the overall system.   Knowledge Questionnaire Score:     Knowledge Questionnaire Score - 01/07/17 1037      Knowledge Questionnaire Score   Pre Score 20/24      Core Components/Risk Factors/Patient Goals at Admission:     Personal Goals and Risk Factors at Admission - 01/07/17 1231      Core Components/Risk Factors/Patient Goals on Admission   Hypertension Yes   Intervention Provide education on lifestyle modifcations including regular physical activity/exercise, weight management, moderate sodium restriction and increased consumption of fresh fruit, vegetables, and low fat dairy, alcohol moderation, and smoking cessation.;Monitor prescription use compliance.   Expected Outcomes Long Term: Maintenance of blood pressure at goal levels.;Short Term: Continued assessment and intervention until BP is < 140/48mm HG in hypertensive participants. < 130/95mm HG in hypertensive participants with diabetes, heart failure or chronic kidney disease.   Lipids Yes   Intervention Provide education and support for participant on nutrition & aerobic/resistive exercise along with prescribed medications to achieve LDL 70mg , HDL >40mg .   Expected Outcomes Short Term: Participant states understanding of desired cholesterol values and is compliant with medications prescribed. Participant is following exercise prescription and nutrition guidelines.;Long Term: Cholesterol controlled with medications as prescribed, with individualized exercise RX and with personalized nutrition plan. Value goals: LDL < 70mg , HDL > 40 mg.      Core Components/Risk Factors/Patient Goals Review:    Core Components/Risk Factors/Patient  Goals at Discharge (Final Review):    ITP Comments:     ITP Comments    Row Name 01/07/17 0833           ITP Comments Medical Director, Dr. Fransico Him          Comments: Pt is making expected progress toward personal goals  after completing 2 sessions. Recommend continued exercise and life style modification education including  stress management and relaxation techniques to decrease cardiac risk profile.

## 2017-01-17 ENCOUNTER — Encounter (HOSPITAL_COMMUNITY): Payer: BLUE CROSS/BLUE SHIELD

## 2017-01-20 ENCOUNTER — Encounter (HOSPITAL_COMMUNITY)
Admission: RE | Admit: 2017-01-20 | Discharge: 2017-01-20 | Disposition: A | Discharge status: Home / Self Care | Payer: BLUE CROSS/BLUE SHIELD | Source: Ambulatory Visit | Attending: Cardiology | Admitting: Cardiology

## 2017-01-20 DIAGNOSIS — Z951 Presence of aortocoronary bypass graft: Secondary | ICD-10-CM | POA: Diagnosis not present

## 2017-01-20 DIAGNOSIS — I2119 ST elevation (STEMI) myocardial infarction involving other coronary artery of inferior wall: Secondary | ICD-10-CM

## 2017-01-22 ENCOUNTER — Encounter (HOSPITAL_COMMUNITY)
Admission: RE | Admit: 2017-01-22 | Discharge: 2017-01-22 | Disposition: A | Discharge status: Home / Self Care | Payer: BLUE CROSS/BLUE SHIELD | Source: Ambulatory Visit | Attending: Cardiology | Admitting: Cardiology

## 2017-01-22 DIAGNOSIS — I2119 ST elevation (STEMI) myocardial infarction involving other coronary artery of inferior wall: Secondary | ICD-10-CM

## 2017-01-22 DIAGNOSIS — Z951 Presence of aortocoronary bypass graft: Secondary | ICD-10-CM | POA: Diagnosis not present

## 2017-01-24 ENCOUNTER — Encounter (HOSPITAL_COMMUNITY)
Admission: RE | Admit: 2017-01-24 | Discharge: 2017-01-24 | Disposition: A | Discharge status: Home / Self Care | Payer: BLUE CROSS/BLUE SHIELD | Source: Ambulatory Visit | Attending: Cardiology | Admitting: Cardiology

## 2017-01-24 DIAGNOSIS — I2119 ST elevation (STEMI) myocardial infarction involving other coronary artery of inferior wall: Secondary | ICD-10-CM

## 2017-01-24 DIAGNOSIS — Z951 Presence of aortocoronary bypass graft: Secondary | ICD-10-CM | POA: Diagnosis not present

## 2017-01-27 ENCOUNTER — Encounter (HOSPITAL_COMMUNITY)
Admission: RE | Admit: 2017-01-27 | Discharge: 2017-01-27 | Disposition: A | Discharge status: Home / Self Care | Payer: BLUE CROSS/BLUE SHIELD | Source: Ambulatory Visit | Attending: Cardiology | Admitting: Cardiology

## 2017-01-27 DIAGNOSIS — Z951 Presence of aortocoronary bypass graft: Secondary | ICD-10-CM | POA: Diagnosis not present

## 2017-01-27 DIAGNOSIS — I2119 ST elevation (STEMI) myocardial infarction involving other coronary artery of inferior wall: Secondary | ICD-10-CM

## 2017-01-27 NOTE — Progress Notes (Signed)
Reviewed home exercise with pt today.  Pt plans to walk for exercise, 3x/week in addition to coming to cardiac rehab.  Reviewed THR, pulse, RPE, sign and symptoms, and when to call 911 or MD.  Also discussed weather considerations and indoor options.  Pt voiced understanding.    Noah Cochran [employer]

## 2017-01-29 ENCOUNTER — Encounter (HOSPITAL_COMMUNITY)
Admission: RE | Admit: 2017-01-29 | Discharge: 2017-01-29 | Disposition: A | Discharge status: Home / Self Care | Payer: BLUE CROSS/BLUE SHIELD | Source: Ambulatory Visit | Attending: Cardiology | Admitting: Cardiology

## 2017-01-29 DIAGNOSIS — I2119 ST elevation (STEMI) myocardial infarction involving other coronary artery of inferior wall: Secondary | ICD-10-CM

## 2017-01-29 DIAGNOSIS — Z951 Presence of aortocoronary bypass graft: Secondary | ICD-10-CM | POA: Diagnosis not present

## 2017-01-31 ENCOUNTER — Encounter (HOSPITAL_COMMUNITY)
Admission: RE | Admit: 2017-01-31 | Discharge: 2017-01-31 | Disposition: A | Discharge status: Home / Self Care | Payer: BLUE CROSS/BLUE SHIELD | Source: Ambulatory Visit | Attending: Cardiology | Admitting: Cardiology

## 2017-01-31 DIAGNOSIS — I2119 ST elevation (STEMI) myocardial infarction involving other coronary artery of inferior wall: Secondary | ICD-10-CM

## 2017-01-31 DIAGNOSIS — Z951 Presence of aortocoronary bypass graft: Secondary | ICD-10-CM | POA: Diagnosis not present

## 2017-02-03 ENCOUNTER — Encounter (HOSPITAL_COMMUNITY)
Admission: RE | Admit: 2017-02-03 | Discharge: 2017-02-03 | Disposition: A | Discharge status: Home / Self Care | Payer: BLUE CROSS/BLUE SHIELD | Source: Ambulatory Visit | Attending: Cardiology | Admitting: Cardiology

## 2017-02-03 DIAGNOSIS — I2119 ST elevation (STEMI) myocardial infarction involving other coronary artery of inferior wall: Secondary | ICD-10-CM

## 2017-02-03 DIAGNOSIS — Z951 Presence of aortocoronary bypass graft: Secondary | ICD-10-CM

## 2017-02-03 NOTE — Progress Notes (Signed)
Noah Cochran 66 y.o. male Nutrition Note Spoke with pt. Nutrition Survey reviewed with pt. Pt is following Step 1 of the Therapeutic Lifestyle Changes diet. Pt wants to gain wt to his UBW range of 135-140 lb. Pt states his lowest wt was 114 lb. Per discussion, Pt lost ~21 to 26 lb over the past 6 months due to "my son moved out and I found it hard to cook for myself." Pt eats most of his meals at home. Pt states he eats out "maybe once a month." Pt currently drinking Boost/Ensure to help with wt gain. Pt educated re: high calorie, high protein diet. Pt expressed understanding of the information reviewed. Pt aware of nutrition education classes offered and plans on attending nutrition classes. Lab Results  Component Value Date   HGBA1C 5.1 10/18/2016   Wt Readings from Last 3 Encounters:  01/07/17 122 lb 9.2 oz (55.6 kg)  11/25/16 115 lb 3.2 oz (52.3 kg)  10/24/16 125 lb 6.4 oz (56.9 kg)   Nutrition Diagnosis ? Food-and nutrition-related knowledge deficit related to lack of exposure to information as related to diagnosis of: ? CVD  Nutrition Intervention ? Benefits of adopting Therapeutic Lifestyle Changes discussed when [employer] reviewed. ? Pt to attend the Portion Distortion class ? Pt to attend the  ? Nutrition I class                      ? Nutrition II class ? Pt given handouts for: ? Nutrition I class ? Nutrition II class ? Continue client-centered nutrition education by RD, as part of interdisciplinary care.  Goal(s) ? Pt to identify food quantities necessary to achieve weight gain towards pt's UBW of 135-140 lb at graduation from cardiac rehab.   Monitor and Evaluate progress toward nutrition goal with team.  Derek Mound, M.Ed, RD, LDN, CDE 02/03/2017 9:56 AM

## 2017-02-05 ENCOUNTER — Encounter (HOSPITAL_COMMUNITY)
Admission: RE | Admit: 2017-02-05 | Discharge: 2017-02-05 | Disposition: A | Discharge status: Home / Self Care | Payer: BLUE CROSS/BLUE SHIELD | Source: Ambulatory Visit | Attending: Cardiology | Admitting: Cardiology

## 2017-02-05 DIAGNOSIS — Z951 Presence of aortocoronary bypass graft: Secondary | ICD-10-CM

## 2017-02-05 DIAGNOSIS — I2119 ST elevation (STEMI) myocardial infarction involving other coronary artery of inferior wall: Secondary | ICD-10-CM

## 2017-02-07 ENCOUNTER — Encounter (HOSPITAL_COMMUNITY)
Admission: RE | Admit: 2017-02-07 | Discharge: 2017-02-07 | Disposition: A | Discharge status: Home / Self Care | Payer: BLUE CROSS/BLUE SHIELD | Source: Ambulatory Visit | Attending: Cardiology | Admitting: Cardiology

## 2017-02-07 DIAGNOSIS — Z951 Presence of aortocoronary bypass graft: Secondary | ICD-10-CM | POA: Diagnosis not present

## 2017-02-07 DIAGNOSIS — I2119 ST elevation (STEMI) myocardial infarction involving other coronary artery of inferior wall: Secondary | ICD-10-CM

## 2017-02-10 ENCOUNTER — Encounter (HOSPITAL_COMMUNITY)
Admission: RE | Admit: 2017-02-10 | Discharge: 2017-02-10 | Disposition: A | Discharge status: Home / Self Care | Payer: BLUE CROSS/BLUE SHIELD | Source: Ambulatory Visit | Attending: Cardiology | Admitting: Cardiology

## 2017-02-10 DIAGNOSIS — Z951 Presence of aortocoronary bypass graft: Secondary | ICD-10-CM | POA: Diagnosis not present

## 2017-02-10 DIAGNOSIS — I2119 ST elevation (STEMI) myocardial infarction involving other coronary artery of inferior wall: Secondary | ICD-10-CM

## 2017-02-11 NOTE — Progress Notes (Signed)
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  losartan (COZAAR) 25 MG tablet, Take 25 mg by mouth daily., Disp: , Rfl:  .  aspirin EC 81 MG tablet, Take 81 mg by mouth daily., Disp: , Rfl:  .  atorvastatin (LIPITOR) 80 MG tablet, Take 1 tablet (80 mg total) by mouth daily at 6 PM., Disp: 30 tablet, Rfl: 1 .  EPINEPHrine (EPIPEN 2-PAK) 0.3 mg/0.3 mL IJ SOAJ injection, Inject 0.3 mLs (0.3 mg total) into the muscle once., Disp: 1 Device, Rfl: 2 .  folic acid (FOLVITE) 1 MG tablet, Take 1 tablet (1 mg total) by mouth daily., Disp: 30 tablet, Rfl: 1 .  metoprolol tartrate (LOPRESSOR) 25 MG tablet, Take 1 tablet (25 mg total) by mouth 2 (two) times daily., Disp: 60 tablet, Rfl: 1 .  NITROGLYCERIN SL, Place 1 tablet under the tongue daily as needed., Disp: , Rfl:   Past Medical History: Past Medical History:  Diagnosis Date  . 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 Coronary artery disease   . Elevated prostate specific antigen (PSA)   . History of chicken pox   . 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 Prostate cancer ([place])     Tobacco Use: History  Smoking Status  . Former Smoker  . Packs/day: 1.00  . Years: 40.00  . Types: Cigarettes  . Quit date: 10/12/2016  Smokeless Tobacco  . Never Used    Comment: 1 ppd     Labs: Recent  Review Flowsheet Data    Labs for ITP Cardiac and Pulmonary Rehab Latest Ref Rng & Units 10/19/2016 10/19/2016 10/20/2016 10/20/2016 10/20/2016   Cholestrol 0 - 200 mg/dL - - - - -   LDLCALC 0 - 99 mg/dL - - - - -   HDL >40 mg/dL - - - - -   Trlycerides <150 mg/dL - - - - -   Hemoglobin A1c 4.8 - 5.6 % - - - - -   PHART 7.350 - 7.450 7.337(L) 7.342(L) 7.403 7.364 -   PCO2ART 32.0 - 48.0 mmHg 45.2 46.0 36.6 41.3 -   HCO3 20.0 - 28.0 mmol/L 24.2 24.9 22.6 23.4 -   TCO2 0 - 100 mmol/L 26 26 24 25 23    ACIDBASEDEF 0.0 - 2.0 mmol/L 2.0 1.0 2.0 2.0 -   O2SAT % 100.0 100.0 98.0 97.0 -      Capillary Blood Glucose: Lab Results  Component Value Date   GLUCAP 97 10/24/2016   GLUCAP 115 (H) 10/23/2016   GLUCAP 107 (H) 10/23/2016   GLUCAP 114 (H) 10/23/2016   GLUCAP 98 10/23/2016     Exercise Target Goals:    Exercise Program Goal: Individual exercise prescription set with THRR, safety & activity barriers. Participant demonstrates ability to understand and report RPE using BORG scale, to self-measure pulse accurately, and to  acknowledge the importance of the exercise prescription.  Exercise Prescription Goal: Starting with aerobic activity 30 plus minutes a day, 3 days per week for initial exercise prescription. Provide home exercise prescription and guidelines that participant acknowledges understanding prior to discharge.  Activity Barriers & Risk Stratification:     Activity Barriers & Cardiac Risk Stratification - 01/07/17 0836      Activity Barriers & Cardiac Risk Stratification   Activity Barriers Other (comment)   Comments L shoulder [facility] ~25years ago   Cardiac Risk Stratification High      6 Minute Walk:     6 Minute Walk    Row Name 01/07/17 1114         6 Minute Walk   Phase Initial     Distance 1411 feet     Walk Time 6 minutes     # of Rest Breaks 0     MPH 2.67     METS 3.48     RPE 11     VO2 Peak 12.18     Symptoms No     Resting HR 73 bpm      Resting BP 104/70     Max Ex. HR 93 bpm     Max Ex. BP 108/70     2 Minute Post BP 106/70        Oxygen Initial Assessment:   Oxygen Re-Evaluation:   Oxygen Discharge (Final Oxygen Re-Evaluation):   Initial Exercise Prescription:     Initial Exercise Prescription - 01/07/17 1200      Date of Initial Exercise RX and Referring Provider   Date 01/07/17   Referring Provider Charolette Forward, MD     Bike   Level 0.7   Minutes 10   METs 3.4     NuStep   Level 3   SPM 85   Minutes 10   METs 3     Track   Laps 12   Minutes 10   METs 3.09     Prescription Details   Frequency (times per week) 3   Duration Progress to 30 minutes of continuous aerobic without signs/symptoms of physical distress     Intensity   THRR 40-80% of Max Heartrate 62-125   Ratings of Perceived Exertion 11-13   Perceived Dyspnea 0-4     Progression   Progression Continue to progress workloads to maintain intensity without signs/symptoms of physical distress.     Resistance Training   Training Prescription Yes   Weight 3lb   Reps 10-15      Perform Capillary Blood Glucose checks as needed.  Exercise Prescription Changes:     Exercise Prescription Changes    Row Name 01/13/17 1600 01/27/17 1000 02/11/17 1600         Response to Exercise   Blood Pressure (Admit) 108/70 114/70 108/60     Blood Pressure (Exercise) 114/70 120/78 124/60     Blood Pressure (Exit) 110/72 100/70 104/62     Heart Rate (Admit) 79 bpm 76 bpm 72 bpm     Heart Rate (Exercise) 92 bpm 103 bpm 111 bpm     Heart Rate (Exit) 75 bpm 76 bpm 76 bpm     Rating of Perceived Exertion (Exercise) 12 12 13      Duration Continue with 30 min of aerobic exercise without signs/symptoms of physical distress. Continue with 30 min of aerobic exercise without signs/symptoms of physical distress. Continue with 30 min of aerobic exercise without signs/symptoms of physical distress.  Intensity THRR unchanged THRR unchanged THRR  unchanged       Progression   Progression Continue to progress workloads to maintain intensity without signs/symptoms of physical distress. Continue to progress workloads to maintain intensity without signs/symptoms of physical distress. Continue to progress workloads to maintain intensity without signs/symptoms of physical distress.     Average METs 2.9 3.6 3.7       Resistance Training   Training Prescription Yes Yes Yes     Weight 3lb 3lb 4lbs     Reps 10-15 10-15 10-15     Time 10 Minutes 10 Minutes 10 Minutes       Bike   Level 0.7 0.8  -     Minutes 10 10  -     METs 3.38 3.74  -       Recumbant Bike   Level  -  - 3.5     Minutes  -  - 10     METs  -  - 3.7       NuStep   Level 3 4 4      SPM 85 90 90     Minutes 10 10 10      METs 2.4 3.7 4       Track   Laps 12 14 14      Minutes 10 10 10      METs 3.09 3.43 3.43       Home Exercise Plan   Plans to continue exercise at  - Home (comment)  walking Home (comment)  walking     Frequency  - Add 3 additional days to program exercise sessions. Add 3 additional days to program exercise sessions.     Initial Home Exercises Provided  - 01/27/17 01/27/17        Exercise Comments:     Exercise Comments    Row Name 01/14/17 1420 02/05/17 1523 02/11/17 1612       Exercise Comments Pt responded to first exercise session very well. Pt exercise for 30 minutes without s/s of CP, dizziness or SOB.  Reviewed METs and goals. Pt is tolerating exercise very well; will continue to monitor exercise progression. Reviewed METs and goals. Pt is tolerating exercise very well; will continue to monitor exercise progression.        Exercise Goals and Review:     Exercise Goals    Row Name 01/07/17 [phone]             Exercise Goals   Increase Physical Activity Yes       Intervention Provide advice, education, support and counseling about physical activity/exercise needs.;Develop an individualized exercise prescription for aerobic  and resistive training based on initial evaluation findings, risk stratification, comorbidities and participant's personal goals.       Expected Outcomes Achievement of increased cardiorespiratory fitness and enhanced flexibility, muscular endurance and strength shown through measurements of functional capacity and personal statement of participant.       Increase Strength and Stamina Yes  Return to normal activities       Intervention Develop an individualized exercise prescription for aerobic and resistive training based on initial evaluation findings, risk stratification, comorbidities and participant's personal goals.;Provide advice, education, support and counseling about physical activity/exercise needs.       Expected Outcomes Achievement of increased cardiorespiratory fitness and enhanced flexibility, muscular endurance and strength shown through measurements of functional capacity and personal statement of participant.          Exercise Goals Re-Evaluation :  Exercise Goals Re-Evaluation    Row Name 01/27/17 0855             Exercise Goal Re-Evaluation   Exercise Goals Review Increase Physical Activity;Increase Strenth and Stamina       Comments eviewed home exercise with pt today.  Pt plans to walk for exercise, 3x/week in addition to coming to cardiac rehab.  Reviewed THR, pulse, RPE, sign and symptoms, and when to call 911 or MD.  Also discussed weather considerations and indoor options.  Pt voiced understanding.       Expected Outcomes Pt willbe compliant with HEP and continue to improve in cardiorespiratory fitness           Discharge Exercise Prescription (Final Exercise Prescription Changes):     Exercise Prescription Changes - 02/11/17 1600      Response to Exercise   Blood Pressure (Admit) 108/60   Blood Pressure (Exercise) 124/60   Blood Pressure (Exit) 104/62   Heart Rate (Admit) 72 bpm   Heart Rate (Exercise) 111 bpm   Heart Rate (Exit) 76 bpm   Rating  of Perceived Exertion (Exercise) 13   Duration Continue with 30 min of aerobic exercise without signs/symptoms of physical distress.   Intensity THRR unchanged     Progression   Progression Continue to progress workloads to maintain intensity without signs/symptoms of physical distress.   Average METs 3.7     Resistance Training   Training Prescription Yes   Weight 4lbs   Reps 10-15   Time 10 Minutes     Recumbant Bike   Level 3.5   Minutes 10   METs 3.7     NuStep   Level 4   SPM 90   Minutes 10   METs 4     Track   Laps 14   Minutes 10   METs 3.43     Home Exercise Plan   Plans to continue exercise at Home (comment)  walking   Frequency Add 3 additional days to program exercise sessions.   Initial Home Exercises Provided 01/27/17      Nutrition:  Target Goals: Understanding of nutrition guidelines, daily intake of sodium 1500mg , cholesterol 200mg , calories 30% from fat and 7% or less from saturated fats, daily to have 5 or more servings of fruits and vegetables.  Biometrics:     Pre Biometrics - 01/07/17 1113      Pre Biometrics   Height 5\' 4"  (1.626 m)   Weight 122 lb 9.2 oz (55.6 kg)   Waist Circumference 32 inches   Hip Circumference 34 inches   Waist to Hip Ratio 0.94 %   BMI (Calculated) 21.1   Triceps Skinfold 8 mm   % Body Fat 19.1 %   Grip Strength 23 kg   Flexibility 12.5 in   Single Leg Stand 20.87 seconds       Nutrition Therapy Plan and Nutrition Goals:     Nutrition Therapy & Goals - 02/03/17 1001      Nutrition Therapy   Diet High Calorie, High Protein until UBW range achieved     Personal Nutrition Goals   Nutrition Goal Pt to gain wt to UBW range of 135-140 lb at graduation from Cardiac Rehab.      Intervention Plan   Intervention Prescribe, educate and counsel regarding individualized specific dietary modifications aiming towards targeted core components such as weight, hypertension, lipid management, diabetes, heart  failure and other comorbidities.   Expected Outcomes Short Term Goal: Understand basic  principles of dietary content, such as calories, fat, sodium, cholesterol and nutrients.;Long Term Goal: Adherence to prescribed nutrition plan.      Nutrition Discharge: Nutrition Scores:     Nutrition Assessments - 02/03/17 1001      MEDFICTS Scores   Pre Score 45      Nutrition Goals Re-Evaluation:   Nutrition Goals Re-Evaluation:   Nutrition Goals Discharge (Final Nutrition Goals Re-Evaluation):   Psychosocial: Target Goals: Acknowledge presence or absence of significant depression and/or stress, maximize coping skills, provide positive support system. Participant is able to verbalize types and ability to use techniques and skills needed for reducing stress and depression.  Initial Review & Psychosocial Screening:     Initial Psych Review & Screening - 01/07/17 1112      Initial Review   Current issues with None Identified     Family Dynamics   Good Support System? Yes  family, church      Barriers   Psychosocial barriers to participate in program There are no identifiable barriers or psychosocial needs.     Screening Interventions   Interventions Encouraged to exercise;Provide feedback about the scores to participant      Quality of Life Scores:     Quality of Life - 01/07/17 1037      Quality of Life Scores   Health/Function Pre 25.77 %   Socioeconomic Pre 24.29 %   Psych/Spiritual Pre 28.57 %   Family Pre 30 %   GLOBAL Pre 26.56 %      PHQ-9: Recent Review Flowsheet Data    Depression screen [facility] 2/9 01/13/2017 02/29/2016   Decreased Interest 0 0   Down, Depressed, Hopeless 0 0   PHQ - 2 Score 0 0     Interpretation of Total Score  Total Score Depression Severity:  1-4 = Minimal depression, 5-9 = Mild depression, 10-14 = Moderate depression, 15-19 = Moderately severe depression, 20-27 = Severe depression   Psychosocial Evaluation and Intervention:      Psychosocial Evaluation - 01/13/17 1650      Psychosocial Evaluation & Interventions   Interventions Encouraged to exercise with the program and follow exercise prescription   Comments no psychosocial needs identified, no interventions necessary    Expected Outcomes pt will exhibit positive outlook with good coping skills.    Continue Psychosocial Services  No Follow up required      Psychosocial Re-Evaluation:     Psychosocial Re-Evaluation    [facility] Name 02/11/17 [phone]             Psychosocial Re-Evaluation   Current issues with None Identified       Comments no psychosocial needs identified, no interventions necessary       Expected Outcomes pt will exhibit positive outlook with good coping skills.        Interventions Encouraged to attend Cardiac Rehabilitation for the exercise       Continue Psychosocial Services  No Follow up required          Psychosocial Discharge (Final Psychosocial Re-Evaluation):     Psychosocial Re-Evaluation - 02/11/17 0931      Psychosocial Re-Evaluation   Current issues with None Identified   Comments no psychosocial needs identified, no interventions necessary   Expected Outcomes pt will exhibit positive outlook with good coping skills.    Interventions Encouraged to attend Cardiac Rehabilitation for the exercise   Continue Psychosocial Services  No Follow up required      Vocational Rehabilitation: Provide vocational rehab assistance to  qualifying candidates.   Vocational Rehab Evaluation & Intervention:     Vocational Rehab - 01/07/17 1112      Initial Vocational Rehab Evaluation & Intervention   Assessment shows need for Vocational Rehabilitation No      Education: Education Goals: Education classes will be provided on a weekly basis, covering required topics. Participant will state understanding/return demonstration of topics presented.  Learning Barriers/Preferences:     Learning Barriers/Preferences - 01/07/17 0836       Learning Barriers/Preferences   Learning Barriers None   Learning Preferences Written Material;Skilled Demonstration      Education Topics: Count Your Pulse:  -Group instruction provided by verbal instruction, demonstration, patient participation and written materials to support subject.  Instructors address importance of being able to find your pulse and how to count your pulse when at home without a heart monitor.  Patients get hands on experience counting their pulse with staff help and individually.   CARDIAC REHAB PHASE II EXERCISE from 02/03/2017 in [facility]  Date  01/24/17  Instruction Review Code  2- meets goals/outcomes      Heart Attack, Angina, and Risk Factor Modification:  -Group instruction provided by verbal instruction, video, and written materials to support subject.  Instructors address signs and symptoms of angina and heart attacks.    Also discuss risk factors for heart disease and how to make changes to improve heart health risk factors.   Functional Fitness:  -Group instruction provided by verbal instruction, demonstration, patient participation, and written materials to support subject.  Instructors address safety measures for doing things around the house.  Discuss how to get up and down off the floor, how to pick things up properly, how to safely get out of a chair without assistance, and balance training.   Meditation and Mindfulness:  -Group instruction provided by verbal instruction, patient participation, and written materials to support subject.  Instructor addresses importance of mindfulness and meditation practice to help reduce stress and improve awareness.  Instructor also leads participants through a meditation exercise.    Stretching for Flexibility and Mobility:  -Group instruction provided by verbal instruction, patient participation, and written materials to support subject.  Instructors lead participants through  series of stretches that are designed to increase flexibility thus improving mobility.  These stretches are additional exercise for major muscle groups that are typically performed during regular warm up and cool down.   Hands Only CPR:  -Group verbal, video, and participation provides a basic overview of AHA guidelines for community CPR. Role-play of emergencies allow participants the opportunity to practice calling for help and chest compression technique with discussion of AED use.   Hypertension: -Group verbal and written instruction that provides a basic overview of hypertension including the most recent diagnostic guidelines, risk factor reduction with self-care instructions and medication management.    Nutrition I class: Heart Healthy Eating:  -Group instruction provided by PowerPoint slides, verbal discussion, and written materials to support subject matter. The instructor gives an explanation and review of the Therapeutic Lifestyle Changes diet recommendations, which includes a discussion on lipid goals, dietary fat, sodium, fiber, plant stanol/sterol esters, sugar, and the components of a well-balanced, healthy diet.   CARDIAC REHAB PHASE II EXERCISE from 02/03/2017 in [place]  Date  02/03/17  Educator  RD  Instruction Review Code  Not applicable [class handouts given]      Nutrition II class: Lifestyle Skills:  -Group instruction provided by PowerPoint  slides, verbal discussion, and written materials to support subject matter. The instructor gives an explanation and review of label reading, grocery shopping for heart health, heart healthy recipe modifications, and ways to make healthier choices when eating out.   CARDIAC REHAB PHASE II EXERCISE from 02/03/2017 in [place]  Date  02/03/17  Educator  RD  Instruction Review Code  Not applicable [class handouts given]      Diabetes Question & Answer:  -Group  instruction provided by PowerPoint slides, verbal discussion, and written materials to support subject matter. The instructor gives an explanation and review of diabetes co-morbidities, pre- and post-prandial blood glucose goals, pre-exercise blood glucose goals, signs, symptoms, and treatment of hypoglycemia and hyperglycemia, and foot care basics.   CARDIAC REHAB PHASE II EXERCISE from 02/03/2017 in [place]  Date  01/31/17  Educator  RD  Instruction Review Code  2- meets goals/outcomes      Diabetes Blitz:  -Group instruction provided by PowerPoint slides, verbal discussion, and written materials to support subject matter. The instructor gives an explanation and review of the physiology behind type 1 and type 2 diabetes, diabetes medications and rational behind using different medications, pre- and post-prandial blood glucose recommendations and Hemoglobin A1c goals, diabetes diet, and exercise including blood glucose guidelines for exercising safely.    Portion Distortion:  -Group instruction provided by PowerPoint slides, verbal discussion, written materials, and food models to support subject matter. The instructor gives an explanation of serving size versus portion size, changes in portions sizes over the last 20 years, and what consists of a serving from each food group.   CARDIAC REHAB PHASE II EXERCISE from 02/03/2017 in [place]  Date  01/16/17  Educator  RD  Instruction Review Code  2- meets goals/outcomes      Stress Management:  -Group instruction provided by verbal instruction, video, and written materials to support subject matter.  Instructors review role of stress in heart disease and how to cope with stress positively.     CARDIAC REHAB PHASE II EXERCISE from 02/03/2017 in [place]  Date  01/22/17  Instruction Review Code  2- meets goals/outcomes      Exercising on Your  Own:  -Group instruction provided by verbal instruction, power point, and written materials to support subject.  Instructors discuss benefits of exercise, components of exercise, frequency and intensity of exercise, and end points for exercise.  Also discuss use of nitroglycerin and activating EMS.  Review options of places to exercise outside of rehab.  Review guidelines for sex with heart disease.   Cardiac Drugs I:  -Group instruction provided by verbal instruction and written materials to support subject.  Instructor reviews cardiac drug classes: antiplatelets, anticoagulants, beta blockers, and statins.  Instructor discusses reasons, side effects, and lifestyle considerations for each drug class.   Cardiac Drugs II:  -Group instruction provided by verbal instruction and written materials to support subject.  Instructor reviews cardiac drug classes: angiotensin converting enzyme inhibitors (ACE-I), angiotensin II receptor blockers (ARBs), nitrates, and calcium channel blockers.  Instructor discusses reasons, side effects, and lifestyle considerations for each drug class.   CARDIAC REHAB PHASE II EXERCISE from 02/03/2017 in [place]  Date  01/29/17  Educator  Kennyth Lose  Instruction Review Code  2- meets goals/outcomes      Anatomy and Physiology of the Circulatory System:  Group verbal and written instruction  and models provide basic cardiac anatomy and physiology, with the coronary electrical and arterial systems. Review of: AMI, Angina, Valve disease, Heart Failure, Peripheral Artery Disease, Cardiac Arrhythmia, Pacemakers, and the ICD.   Other Education:  -Group or individual verbal, written, or video instructions that support the educational goals of the cardiac rehab program.   Knowledge Questionnaire Score:     Knowledge Questionnaire Score - 01/07/17 1037      Knowledge Questionnaire Score   Pre Score 20/24      Core Components/Risk  Factors/Patient Goals at Admission:     Personal Goals and Risk Factors at Admission - 01/07/17 1231      Core Components/Risk Factors/Patient Goals on Admission   Hypertension Yes   Intervention Provide education on lifestyle modifcations including regular physical activity/exercise, weight management, moderate sodium restriction and increased consumption of fresh fruit, vegetables, and low fat dairy, alcohol moderation, and smoking cessation.;Monitor prescription use compliance.   Expected Outcomes Long Term: Maintenance of blood pressure at goal levels.;Short Term: Continued assessment and intervention until BP is < 140/56mm HG in hypertensive participants. < 130/30mm HG in hypertensive participants with diabetes, heart failure or chronic kidney disease.   Lipids Yes   Intervention Provide education and support for participant on nutrition & aerobic/resistive exercise along with prescribed medications to achieve LDL 70mg , HDL >40mg .   Expected Outcomes Short Term: Participant states understanding of desired cholesterol values and is compliant with medications prescribed. Participant is following exercise prescription and nutrition guidelines.;Long Term: Cholesterol controlled with medications as prescribed, with individualized exercise RX and with personalized nutrition plan. Value goals: LDL < 70mg , HDL > 40 mg.      Core Components/Risk Factors/Patient Goals Review:      Goals and Risk Factor Review    Row Name 02/11/17 0929             Core Components/Risk Factors/Patient Goals Review   Personal Goals Review Hypertension;Lipids       Review pt it pleased he is beginning to gain weight.  Recent HTN medication adjustments per Dr. Terrence Dupont.  pt is exercising on his own at home.        Expected Outcomes pt will participate in CR exercise, nutrition and lifestyle education opportunities to decrease overall CAD RF.            Core Components/Risk Factors/Patient Goals at Discharge  (Final Review):      Goals and Risk Factor Review - 02/11/17 0929      Core Components/Risk Factors/Patient Goals Review   Personal Goals Review Hypertension;Lipids   Review pt it pleased he is beginning to gain weight.  Recent HTN medication adjustments per Dr. Terrence Dupont.  pt is exercising on his own at home.    Expected Outcomes pt will participate in CR exercise, nutrition and lifestyle education opportunities to decrease overall CAD RF.        ITP Comments:     ITP Comments    Row Name 01/07/17 [phone]           ITP Comments Medical Director, Dr. Fransico Him          Comments: Pt is making expected progress toward personal goals after completing 12 sessions. Recommend continued exercise and life style modification education including  stress management and relaxation techniques to decrease cardiac risk profile.

## 2017-02-12 ENCOUNTER — Encounter (HOSPITAL_COMMUNITY)
Admission: RE | Admit: 2017-02-12 | Discharge: 2017-02-12 | Disposition: A | Discharge status: Home / Self Care | Payer: BLUE CROSS/BLUE SHIELD | Source: Ambulatory Visit | Attending: Cardiology | Admitting: Cardiology

## 2017-02-12 DIAGNOSIS — I2119 ST elevation (STEMI) myocardial infarction involving other coronary artery of inferior wall: Secondary | ICD-10-CM

## 2017-02-12 DIAGNOSIS — Z951 Presence of aortocoronary bypass graft: Secondary | ICD-10-CM

## 2017-02-14 ENCOUNTER — Encounter (HOSPITAL_COMMUNITY)
Admission: RE | Admit: 2017-02-14 | Discharge: 2017-02-14 | Disposition: A | Discharge status: Home / Self Care | Payer: BLUE CROSS/BLUE SHIELD | Source: Ambulatory Visit | Attending: Cardiology | Admitting: Cardiology

## 2017-02-14 DIAGNOSIS — Z951 Presence of aortocoronary bypass graft: Secondary | ICD-10-CM

## 2017-02-14 DIAGNOSIS — I2119 ST elevation (STEMI) myocardial infarction involving other coronary artery of inferior wall: Secondary | ICD-10-CM

## 2017-02-19 ENCOUNTER — Encounter (HOSPITAL_COMMUNITY)
Admission: RE | Admit: 2017-02-19 | Discharge: 2017-02-19 | Disposition: A | Discharge status: Home / Self Care | Payer: BLUE CROSS/BLUE SHIELD | Source: Ambulatory Visit | Attending: Cardiology | Admitting: Cardiology

## 2017-02-19 DIAGNOSIS — I2119 ST elevation (STEMI) myocardial infarction involving other coronary artery of inferior wall: Secondary | ICD-10-CM

## 2017-02-19 DIAGNOSIS — Z951 Presence of aortocoronary bypass graft: Secondary | ICD-10-CM | POA: Diagnosis not present

## 2017-02-21 ENCOUNTER — Encounter (HOSPITAL_COMMUNITY)
Admission: RE | Admit: 2017-02-21 | Discharge: 2017-02-21 | Disposition: A | Discharge status: Home / Self Care | Payer: BLUE CROSS/BLUE SHIELD | Source: Ambulatory Visit | Attending: Cardiology | Admitting: Cardiology

## 2017-02-21 DIAGNOSIS — I2119 ST elevation (STEMI) myocardial infarction involving other coronary artery of inferior wall: Secondary | ICD-10-CM

## 2017-02-21 DIAGNOSIS — Z951 Presence of aortocoronary bypass graft: Secondary | ICD-10-CM

## 2017-02-24 ENCOUNTER — Encounter (HOSPITAL_COMMUNITY)
Admission: RE | Admit: 2017-02-24 | Discharge: 2017-02-24 | Disposition: A | Discharge status: Home / Self Care | Payer: BLUE CROSS/BLUE SHIELD | Source: Ambulatory Visit | Attending: Cardiology | Admitting: Cardiology

## 2017-02-24 DIAGNOSIS — Z951 Presence of aortocoronary bypass graft: Secondary | ICD-10-CM | POA: Diagnosis not present

## 2017-02-24 DIAGNOSIS — I2119 ST elevation (STEMI) myocardial infarction involving other coronary artery of inferior wall: Secondary | ICD-10-CM

## 2017-02-26 ENCOUNTER — Encounter (HOSPITAL_COMMUNITY): Payer: BLUE CROSS/BLUE SHIELD

## 2017-02-28 ENCOUNTER — Encounter (HOSPITAL_COMMUNITY)
Admission: RE | Admit: 2017-02-28 | Discharge: 2017-02-28 | Disposition: A | Discharge status: Home / Self Care | Payer: BLUE CROSS/BLUE SHIELD | Source: Ambulatory Visit | Attending: Cardiology | Admitting: Cardiology

## 2017-02-28 DIAGNOSIS — Z951 Presence of aortocoronary bypass graft: Secondary | ICD-10-CM | POA: Diagnosis not present

## 2017-02-28 DIAGNOSIS — I2119 ST elevation (STEMI) myocardial infarction involving other coronary artery of inferior wall: Secondary | ICD-10-CM

## 2017-03-03 ENCOUNTER — Encounter (HOSPITAL_COMMUNITY)
Admission: RE | Admit: 2017-03-03 | Discharge: 2017-03-03 | Disposition: A | Discharge status: Home / Self Care | Payer: BLUE CROSS/BLUE SHIELD | Source: Ambulatory Visit | Attending: Cardiology | Admitting: Cardiology

## 2017-03-03 DIAGNOSIS — Z951 Presence of aortocoronary bypass graft: Secondary | ICD-10-CM | POA: Diagnosis not present

## 2017-03-03 DIAGNOSIS — I2119 ST elevation (STEMI) myocardial infarction involving other coronary artery of inferior wall: Secondary | ICD-10-CM

## 2017-03-05 ENCOUNTER — Encounter (HOSPITAL_COMMUNITY)
Admission: RE | Admit: 2017-03-05 | Discharge: 2017-03-05 | Disposition: A | Discharge status: Home / Self Care | Payer: BLUE CROSS/BLUE SHIELD | Source: Ambulatory Visit | Attending: Cardiology | Admitting: Cardiology

## 2017-03-05 DIAGNOSIS — I2119 ST elevation (STEMI) myocardial infarction involving other coronary artery of inferior wall: Secondary | ICD-10-CM

## 2017-03-05 DIAGNOSIS — Z951 Presence of aortocoronary bypass graft: Secondary | ICD-10-CM

## 2017-03-07 ENCOUNTER — Encounter (HOSPITAL_COMMUNITY)
Admission: RE | Admit: 2017-03-07 | Discharge: 2017-03-07 | Disposition: A | Discharge status: Home / Self Care | Payer: BLUE CROSS/BLUE SHIELD | Source: Ambulatory Visit | Attending: Cardiology | Admitting: Cardiology

## 2017-03-07 DIAGNOSIS — Z951 Presence of aortocoronary bypass graft: Secondary | ICD-10-CM

## 2017-03-07 DIAGNOSIS — I2119 ST elevation (STEMI) myocardial infarction involving other coronary artery of inferior wall: Secondary | ICD-10-CM

## 2017-03-10 ENCOUNTER — Encounter (HOSPITAL_COMMUNITY)
Admission: RE | Admit: 2017-03-10 | Discharge: 2017-03-10 | Disposition: A | Discharge status: Home / Self Care | Payer: BLUE CROSS/BLUE SHIELD | Source: Ambulatory Visit | Attending: Cardiology | Admitting: Cardiology

## 2017-03-10 DIAGNOSIS — Z951 Presence of aortocoronary bypass graft: Secondary | ICD-10-CM

## 2017-03-10 DIAGNOSIS — I2119 ST elevation (STEMI) myocardial infarction involving other coronary artery of inferior wall: Secondary | ICD-10-CM

## 2017-03-12 ENCOUNTER — Encounter (HOSPITAL_COMMUNITY)
Admission: RE | Admit: 2017-03-12 | Discharge: 2017-03-12 | Disposition: A | Discharge status: Home / Self Care | Payer: BLUE CROSS/BLUE SHIELD | Source: Ambulatory Visit | Attending: Cardiology | Admitting: Cardiology

## 2017-03-12 DIAGNOSIS — I2119 ST elevation (STEMI) myocardial infarction involving other coronary artery of inferior wall: Secondary | ICD-10-CM

## 2017-03-12 DIAGNOSIS — Z951 Presence of aortocoronary bypass graft: Secondary | ICD-10-CM

## 2017-03-12 NOTE — Progress Notes (Signed)
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  aspirin EC 81 MG tablet, Take 81 mg by mouth daily., Disp: , Rfl:  .  atorvastatin (LIPITOR) 80 MG tablet, Take 1 tablet (80 mg total) by mouth daily at 6 PM., Disp: 30 tablet, Rfl: 1 .  EPINEPHrine (EPIPEN 2-PAK) 0.3 mg/0.3 mL IJ SOAJ injection, Inject 0.3 mLs (0.3 mg total) into the muscle once., Disp: 1 Device, Rfl: 2 .  folic acid (FOLVITE) 1 MG tablet, Take 1 tablet (1 mg total) by mouth daily., Disp: 30 tablet, Rfl: 1 .  losartan (COZAAR) 25 MG tablet, Take 25 mg by mouth daily., Disp: , Rfl:  .  metoprolol tartrate (LOPRESSOR) 25 MG tablet, Take 1 tablet (25 mg total) by mouth 2 (two) times daily., Disp: 60 tablet, Rfl: 1 .  NITROGLYCERIN SL, Place 1 tablet under the tongue daily as needed., Disp: , Rfl:   Past Medical History: Past Medical History:  Diagnosis Date  . 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 Coronary artery disease   . Elevated prostate specific antigen (PSA)   . History of chicken pox   . 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 Prostate cancer ([place])     Tobacco Use: History  Smoking Status  . Former Smoker  . Packs/day: 1.00  . Years: 40.00  . Types: Cigarettes  . Quit date: 10/12/2016  Smokeless Tobacco  . Never Used    Comment: 1 ppd     Labs: Recent  Review Flowsheet Data    Labs for ITP Cardiac and Pulmonary Rehab Latest Ref Rng & Units 10/19/2016 10/19/2016 10/20/2016 10/20/2016 10/20/2016   Cholestrol 0 - 200 mg/dL - - - - -   LDLCALC 0 - 99 mg/dL - - - - -   HDL >40 mg/dL - - - - -   Trlycerides <150 mg/dL - - - - -   Hemoglobin A1c 4.8 - 5.6 % - - - - -   PHART 7.350 - 7.450 7.337(L) 7.342(L) 7.403 7.364 -   PCO2ART 32.0 - 48.0 mmHg 45.2 46.0 36.6 41.3 -   HCO3 20.0 - 28.0 mmol/L 24.2 24.9 22.6 23.4 -   TCO2 0 - 100 mmol/L _0 ACIDBASEDEF 0.0 - 2.0 mmol/L 2.0 1.0 2.0 2.0 -   O2SAT % 100.0 100.0 98.0 97.0 -      Capillary Blood Glucose: Lab Results  Component Value Date   GLUCAP 97 10/24/2016   GLUCAP 115 (H) 10/23/2016   GLUCAP 107 (H) 10/23/2016   GLUCAP 114 (H) 10/23/2016   GLUCAP 98 10/23/2016     Exercise Target Goals:    Exercise Program Goal: Individual exercise prescription set with THRR, safety & activity barriers. Participant demonstrates ability to understand and report RPE using BORG scale, to self-measure pulse accurately, and to  acknowledge the importance of the exercise prescription.  Exercise Prescription Goal: Starting with aerobic activity 30 plus minutes a day, 3 days per week for initial exercise prescription. Provide home exercise prescription and guidelines that participant acknowledges understanding prior to discharge.  Activity Barriers & Risk Stratification:     Activity Barriers & Cardiac Risk Stratification - 01/07/17 0836      Activity Barriers & Cardiac Risk Stratification   Activity Barriers Other (comment)   Comments L shoulder [facility] ~25years ago   Cardiac Risk Stratification High      6 Minute Walk:     6 Minute Walk    Row Name 01/07/17 1114         6 Minute Walk   Phase Initial     Distance 1411 feet     Walk Time 6 minutes     # of Rest Breaks 0     MPH 2.67     METS 3.48     RPE 11     VO2 Peak 12.18     Symptoms No     Resting HR 73 bpm      Resting BP 104/70     Max Ex. HR 93 bpm     Max Ex. BP 108/70     2 Minute Post BP 106/70        Oxygen Initial Assessment:   Oxygen Re-Evaluation:   Oxygen Discharge (Final Oxygen Re-Evaluation):   Initial Exercise Prescription:     Initial Exercise Prescription - 01/07/17 1200      Date of Initial Exercise RX and Referring Provider   Date 01/07/17   Referring Provider Charolette Forward, MD     Bike   Level 0.7   Minutes 10   METs 3.4     NuStep   Level 3   SPM 85   Minutes 10   METs 3     Track   Laps 12   Minutes 10   METs 3.09     Prescription Details   Frequency (times per week) 3   Duration Progress to 30 minutes of continuous aerobic without signs/symptoms of physical distress     Intensity   THRR 40-80% of Max Heartrate 62-125   Ratings of Perceived Exertion 11-13   Perceived Dyspnea 0-4     Progression   Progression Continue to progress workloads to maintain intensity without signs/symptoms of physical distress.     Resistance Training   Training Prescription Yes   Weight 3lb   Reps 10-15      Perform Capillary Blood Glucose checks as needed.  Exercise Prescription Changes:     Exercise Prescription Changes    Row Name 01/13/17 1600 01/27/17 1000 02/11/17 1600 02/25/17 1100       Response to Exercise   Blood Pressure (Admit) 108/70 114/70 108/60 104/60    Blood Pressure (Exercise) 114/70 120/78 124/60 108/60    Blood Pressure (Exit) 110/72 100/70 104/62 110/62    Heart Rate (Admit) 79 bpm 76 bpm 72 bpm 70 bpm    Heart Rate (Exercise) 92 bpm 103 bpm 111 bpm 106 bpm    Heart Rate (Exit) 75 bpm 76 bpm 76 bpm 69 bpm    Rating of Perceived Exertion (Exercise) _0 Duration Continue with 30 min of aerobic exercise without signs/symptoms of physical distress. Continue with 30 min of aerobic exercise without signs/symptoms of physical distress. Continue with 30 min of aerobic exercise without signs/symptoms of physical  distress.  Continue with 30 min of aerobic exercise without signs/symptoms of physical distress.    Intensity THRR unchanged THRR unchanged THRR unchanged THRR unchanged      Progression   Progression Continue to progress workloads to maintain intensity without signs/symptoms of physical distress. Continue to progress workloads to maintain intensity without signs/symptoms of physical distress. Continue to progress workloads to maintain intensity without signs/symptoms of physical distress. Continue to progress workloads to maintain intensity without signs/symptoms of physical distress.    Average METs 2.9 3.6 3.7 3.9      Resistance Training   Training Prescription Yes Yes Yes Yes    Weight 3lb 3lb 4lbs 4lbs    Reps 10-15 10-15 10-15 10-15    Time 10 Minutes 10 Minutes 10 Minutes 10 Minutes      Bike   Level 0.7 0.8  -  -    Minutes 10 10  -  -    METs 3.38 3.74  -  -      Recumbant Bike   Level  -  - 3.5 4    Minutes  -  - 10 10    METs  -  - 3.7 4.3      NuStep   Level _0 SPM 85 90 90 90    Minutes _1 METs 2.4 3.7 4 4.3      Track   Laps _2 Minutes _3 METs 3.09 3.43 3.43 3.6      Home Exercise Plan   Plans to continue exercise at  - Home (comment)  walking Home (comment)  walking Home (comment)  walking    Frequency  - Add 3 additional days to program exercise sessions. Add 3 additional days to program exercise sessions. Add 3 additional days to program exercise sessions.    Initial Home Exercises Provided  - 01/27/17 01/27/17 01/27/17       Exercise Comments:     Exercise Comments    Row Name 01/14/17 1420 02/05/17 1523 02/11/17 1612 03/10/17 1053     Exercise Comments Pt responded to first exercise session very well. Pt exercise for 30 minutes without s/s of CP, dizziness or SOB.  Reviewed METs and goals. Pt is tolerating exercise very well; will continue to monitor exercise progression. Reviewed METs and goals. Pt is tolerating  exercise very well; will continue to monitor exercise progression. Reviewed METs and goals. Pt is tolerating exercise very well; will continue to monitor exercise progression.       Exercise Goals and Review:     Exercise Goals    Row Name 01/07/17 [phone]             Exercise Goals   Increase Physical Activity Yes       Intervention Provide advice, education, support and counseling about physical activity/exercise needs.;Develop an individualized exercise prescription for aerobic and resistive training based on initial evaluation findings, risk stratification, comorbidities and participant's personal goals.       Expected Outcomes Achievement of increased cardiorespiratory fitness and enhanced flexibility, muscular endurance and strength shown through measurements of functional capacity and personal statement of participant.       Increase Strength and Stamina Yes  Return to normal activities       Intervention Develop an individualized exercise prescription for aerobic and resistive training based on initial evaluation findings, risk stratification, comorbidities and  participant's personal goals.;Provide advice, education, support and counseling about physical activity/exercise needs.       Expected Outcomes Achievement of increased cardiorespiratory fitness and enhanced flexibility, muscular endurance and strength shown through measurements of functional capacity and personal statement of participant.          Exercise Goals Re-Evaluation :     Exercise Goals Re-Evaluation    Row Name 01/27/17 0855 03/10/17 1052           Exercise Goal Re-Evaluation   Exercise Goals Review Increase Physical Activity;Increase Strenth and Stamina Increase Physical Activity;Increase Strenth and Stamina      Comments eviewed home exercise with pt today.  Pt plans to walk for exercise, 3x/week in addition to coming to cardiac rehab.  Reviewed THR, pulse, RPE, sign and symptoms, and when to call 911 or  MD.  Also discussed weather considerations and indoor options.  Pt voiced understanding. Pt is tolerating exercise very well; pt has increased MET average on Nustep and overall avg METs. Pt is compliant with home exercise. Pt has returned to all activities at home      Expected Outcomes Pt willbe compliant with HEP and continue to improve in cardiorespiratory fitness Pt will be compliant with HEP and continue to improve in cardiorespiratory fitness          Discharge Exercise Prescription (Final Exercise Prescription Changes):     Exercise Prescription Changes - 02/25/17 1100      Response to Exercise   Blood Pressure (Admit) 104/60   Blood Pressure (Exercise) 108/60   Blood Pressure (Exit) 110/62   Heart Rate (Admit) 70 bpm   Heart Rate (Exercise) 106 bpm   Heart Rate (Exit) 69 bpm   Rating of Perceived Exertion (Exercise) 13   Duration Continue with 30 min of aerobic exercise without signs/symptoms of physical distress.   Intensity THRR unchanged     Progression   Progression Continue to progress workloads to maintain intensity without signs/symptoms of physical distress.   Average METs 3.9     Resistance Training   Training Prescription Yes   Weight 4lbs   Reps 10-15   Time 10 Minutes     Recumbant Bike   Level 4   Minutes 10   METs 4.3     NuStep   Level 4   SPM 90   Minutes 10   METs 4.3     Track   Laps 15   Minutes 10   METs 3.6     Home Exercise Plan   Plans to continue exercise at Home (comment)  walking   Frequency Add 3 additional days to program exercise sessions.   Initial Home Exercises Provided 01/27/17      Nutrition:  Target Goals: Understanding of nutrition guidelines, daily intake of sodium <1522m, cholesterol <2023m calories 30% from fat and 7% or less from saturated fats, daily to have 5 or more servings of fruits and vegetables.  Biometrics:     Pre Biometrics - 01/07/17 1113      Pre Biometrics   Height _0  (1.626 m)    Weight 122 lb 9.2 oz (55.6 kg)   Waist Circumference 32 inches   Hip Circumference 34 inches   Waist to Hip Ratio 0.94 %   BMI (Calculated) 21.1   Triceps Skinfold 8 mm   % Body Fat 19.1 %   Grip Strength 23 kg   Flexibility 12.5 in   Single Leg Stand 20.87 seconds  Nutrition Therapy Plan and Nutrition Goals:     Nutrition Therapy & Goals - 02/03/17 1001      Nutrition Therapy   Diet High Calorie, High Protein until UBW range achieved     Personal Nutrition Goals   Nutrition Goal Pt to gain wt to UBW range of 135-140 lb at graduation from Cardiac Rehab.      Intervention Plan   Intervention Prescribe, educate and counsel regarding individualized specific dietary modifications aiming towards targeted core components such as weight, hypertension, lipid management, diabetes, heart failure and other comorbidities.   Expected Outcomes Short Term Goal: Understand basic principles of dietary content, such as calories, fat, sodium, cholesterol and nutrients.;Long Term Goal: Adherence to prescribed nutrition plan.      Nutrition Discharge: Nutrition Scores:     Nutrition Assessments - 02/03/17 1001      MEDFICTS Scores   Pre Score 45      Nutrition Goals Re-Evaluation:   Nutrition Goals Re-Evaluation:   Nutrition Goals Discharge (Final Nutrition Goals Re-Evaluation):   Psychosocial: Target Goals: Acknowledge presence or absence of significant depression and/or stress, maximize coping skills, provide positive support system. Participant is able to verbalize types and ability to use techniques and skills needed for reducing stress and depression.  Initial Review & Psychosocial Screening:     Initial Psych Review & Screening - 01/07/17 1112      Initial Review   Current issues with None Identified     Family Dynamics   Good Support System? Yes  family, church      Barriers   Psychosocial barriers to participate in program There are no identifiable barriers  or psychosocial needs.     Screening Interventions   Interventions Encouraged to exercise;Provide feedback about the scores to participant      Quality of Life Scores:     Quality of Life - 01/07/17 1037      Quality of Life Scores   Health/Function Pre 25.77 %   Socioeconomic Pre 24.29 %   Psych/Spiritual Pre 28.57 %   Family Pre 30 %   GLOBAL Pre 26.56 %      PHQ-9: Recent Review Flowsheet Data    Depression screen [facility] 2/9 01/13/2017 02/29/2016   Decreased Interest 0 0   Down, Depressed, Hopeless 0 0   PHQ - 2 Score 0 0     Interpretation of Total Score  Total Score Depression Severity:  1-4 = Minimal depression, 5-9 = Mild depression, 10-14 = Moderate depression, 15-19 = Moderately severe depression, 20-27 = Severe depression   Psychosocial Evaluation and Intervention:     Psychosocial Evaluation - 01/13/17 1650      Psychosocial Evaluation & Interventions   Interventions Encouraged to exercise with the program and follow exercise prescription   Comments no psychosocial needs identified, no interventions necessary    Expected Outcomes pt will exhibit positive outlook with good coping skills.    Continue Psychosocial Services  No Follow up required      Psychosocial Re-Evaluation:     Psychosocial Re-Evaluation    [facility] [place] Name 02/11/17 0931 03/12/17 1116           Psychosocial Re-Evaluation   Current issues with None Identified None Identified      Comments no psychosocial needs identified, no interventions necessary no psychosocial needs identified, no interventions necessary      Expected Outcomes pt will exhibit positive outlook with good coping skills.  pt will exhibit positive outlook with good coping skills.  Interventions Encouraged to attend Cardiac Rehabilitation for the exercise Encouraged to attend Cardiac Rehabilitation for the exercise      Continue Psychosocial Services  No Follow up required No Follow up required         Psychosocial  Discharge (Final Psychosocial Re-Evaluation):     Psychosocial Re-Evaluation - 03/12/17 1116      Psychosocial Re-Evaluation   Current issues with None Identified   Comments no psychosocial needs identified, no interventions necessary   Expected Outcomes pt will exhibit positive outlook with good coping skills.    Interventions Encouraged to attend Cardiac Rehabilitation for the exercise   Continue Psychosocial Services  No Follow up required      Vocational Rehabilitation: Provide vocational rehab assistance to qualifying candidates.   Vocational Rehab Evaluation & Intervention:     Vocational Rehab - 01/07/17 1112      Initial Vocational Rehab Evaluation & Intervention   Assessment shows need for Vocational Rehabilitation No      Education: Education Goals: Education classes will be provided on a weekly basis, covering required topics. Participant will state understanding/return demonstration of topics presented.  Learning Barriers/Preferences:     Learning Barriers/Preferences - 01/07/17 0836      Learning Barriers/Preferences   Learning Barriers None   Learning Preferences Written Material;Skilled Demonstration      Education Topics: Count Your Pulse:  -Group instruction provided by verbal instruction, demonstration, patient participation and written materials to support subject.  Instructors address importance of being able to find your pulse and how to count your pulse when at home without a heart monitor.  Patients get hands on experience counting their pulse with staff help and individually.   CARDIAC REHAB PHASE II EXERCISE from 03/05/2017 in [place]  Date  01/24/17  Instruction Review Code  2- meets goals/outcomes      Heart Attack, Angina, and Risk Factor Modification:  -Group instruction provided by verbal instruction, video, and written materials to support subject.  Instructors address signs and symptoms of angina and  heart attacks.    Also discuss risk factors for heart disease and how to make changes to improve heart health risk factors.   CARDIAC REHAB PHASE II EXERCISE from 03/05/2017 in [place]  Date  02/12/17  Instruction Review Code  2- meets goals/outcomes      Functional Fitness:  -Group instruction provided by verbal instruction, demonstration, patient participation, and written materials to support subject.  Instructors address safety measures for doing things around the house.  Discuss how to get up and down off the floor, how to pick things up properly, how to safely get out of a chair without assistance, and balance training.   Meditation and Mindfulness:  -Group instruction provided by verbal instruction, patient participation, and written materials to support subject.  Instructor addresses importance of mindfulness and meditation practice to help reduce stress and improve awareness.  Instructor also leads participants through a meditation exercise.    Stretching for Flexibility and Mobility:  -Group instruction provided by verbal instruction, patient participation, and written materials to support subject.  Instructors lead participants through series of stretches that are designed to increase flexibility thus improving mobility.  These stretches are additional exercise for major muscle groups that are typically performed during regular warm up and cool down.   CARDIAC REHAB PHASE II EXERCISE from 03/05/2017 in [place]  Date  02/14/17  Instruction Review Code  2-  meets goals/outcomes      Hands Only CPR:  -Group verbal, video, and participation provides a basic overview of AHA guidelines for community CPR. Role-play of emergencies allow participants the opportunity to practice calling for help and chest compression technique with discussion of AED use.   Hypertension: -Group verbal and written instruction that provides  a basic overview of hypertension including the most recent diagnostic guidelines, risk factor reduction with self-care instructions and medication management.    Nutrition I class: Heart Healthy Eating:  -Group instruction provided by PowerPoint slides, verbal discussion, and written materials to support subject matter. The instructor gives an explanation and review of the Therapeutic Lifestyle Changes diet recommendations, which includes a discussion on lipid goals, dietary fat, sodium, fiber, plant stanol/sterol esters, sugar, and the components of a well-balanced, healthy diet.   CARDIAC REHAB PHASE II EXERCISE from 03/05/2017 in [place]  Date  02/03/17  Educator  RD  Instruction Review Code  Not applicable [class handouts given]      Nutrition II class: Lifestyle Skills:  -Group instruction provided by PowerPoint slides, verbal discussion, and written materials to support subject matter. The instructor gives an explanation and review of label reading, grocery shopping for heart health, heart healthy recipe modifications, and ways to make healthier choices when eating out.   CARDIAC REHAB PHASE II EXERCISE from 03/05/2017 in [place]  Date  02/03/17  Educator  RD  Instruction Review Code  Not applicable [class handouts given]      Diabetes Question & Answer:  -Group instruction provided by PowerPoint slides, verbal discussion, and written materials to support subject matter. The instructor gives an explanation and review of diabetes co-morbidities, pre- and post-prandial blood glucose goals, pre-exercise blood glucose goals, signs, symptoms, and treatment of hypoglycemia and hyperglycemia, and foot care basics.   CARDIAC REHAB PHASE II EXERCISE from 03/05/2017 in [place]  Date  01/31/17  Educator  RD  Instruction Review Code  2- meets goals/outcomes      Diabetes Blitz:  -Group  instruction provided by PowerPoint slides, verbal discussion, and written materials to support subject matter. The instructor gives an explanation and review of the physiology behind type 1 and type 2 diabetes, diabetes medications and rational behind using different medications, pre- and post-prandial blood glucose recommendations and Hemoglobin A1c goals, diabetes diet, and exercise including blood glucose guidelines for exercising safely.    Portion Distortion:  -Group instruction provided by PowerPoint slides, verbal discussion, written materials, and food models to support subject matter. The instructor gives an explanation of serving size versus portion size, changes in portions sizes over the last 20 years, and what consists of a serving from each food group.   CARDIAC REHAB PHASE II EXERCISE from 03/05/2017 in [place]  Date  01/16/17  Educator  RD  Instruction Review Code  2- meets goals/outcomes      Stress Management:  -Group instruction provided by verbal instruction, video, and written materials to support subject matter.  Instructors review role of stress in heart disease and how to cope with stress positively.     CARDIAC REHAB PHASE II EXERCISE from 03/05/2017 in [place]  Date  01/22/17  Instruction Review Code  2- meets goals/outcomes      Exercising on Your Own:  -Group instruction provided by verbal instruction, power point, and written materials to support subject.  Instructors  discuss benefits of exercise, components of exercise, frequency and intensity of exercise, and end points for exercise.  Also discuss use of nitroglycerin and activating EMS.  Review options of places to exercise outside of rehab.  Review guidelines for sex with heart disease.   Cardiac Drugs I:  -Group instruction provided by verbal instruction and written materials to support subject.  Instructor reviews cardiac drug classes:  antiplatelets, anticoagulants, beta blockers, and statins.  Instructor discusses reasons, side effects, and lifestyle considerations for each drug class.   Cardiac Drugs II:  -Group instruction provided by verbal instruction and written materials to support subject.  Instructor reviews cardiac drug classes: angiotensin converting enzyme inhibitors (ACE-I), angiotensin II receptor blockers (ARBs), nitrates, and calcium channel blockers.  Instructor discusses reasons, side effects, and lifestyle considerations for each drug class.   CARDIAC REHAB PHASE II EXERCISE from 03/05/2017 in [place]  Date  03/05/17  Educator  Kennyth Lose  Instruction Review Code  2- meets goals/outcomes      Anatomy and Physiology of the Circulatory System:  Group verbal and written instruction and models provide basic cardiac anatomy and physiology, with the coronary electrical and arterial systems. Review of: AMI, Angina, Valve disease, Heart Failure, Peripheral Artery Disease, Cardiac Arrhythmia, Pacemakers, and the ICD.   CARDIAC REHAB PHASE II EXERCISE from 03/05/2017 in [place]  Date  02/19/17  Instruction Review Code  2- meets goals/outcomes      Other Education:  -Group or individual verbal, written, or video instructions that support the educational goals of the cardiac rehab program.   Knowledge Questionnaire Score:     Knowledge Questionnaire Score - 01/07/17 1037      Knowledge Questionnaire Score   Pre Score 20/24      Core Components/Risk Factors/Patient Goals at Admission:     Personal Goals and Risk Factors at Admission - 01/07/17 1231      Core Components/Risk Factors/Patient Goals on Admission   Hypertension Yes   Intervention Provide education on lifestyle modifcations including regular physical activity/exercise, weight management, moderate sodium restriction and increased consumption of fresh fruit, vegetables, and low  fat dairy, alcohol moderation, and smoking cessation.;Monitor prescription use compliance.   Expected Outcomes Long Term: Maintenance of blood pressure at goal levels.;Short Term: Continued assessment and intervention until BP is < 140/23m HG in hypertensive participants. < 130/85mHG in hypertensive participants with diabetes, heart failure or chronic kidney disease.   Lipids Yes   Intervention Provide education and support for participant on nutrition & aerobic/resistive exercise along with prescribed medications to achieve LDL <7056mHDL >26m43m Expected Outcomes Short Term: Participant states understanding of desired cholesterol values and is compliant with medications prescribed. Participant is following exercise prescription and nutrition guidelines.;Long Term: Cholesterol controlled with medications as prescribed, with individualized exercise RX and with personalized nutrition plan. Value goals: LDL < 70mg17mL > 40 mg.      Core Components/Risk Factors/Patient Goals Review:      Goals and Risk Factor Review    Row Name 02/11/17 0929 03/12/17 1115           Core Components/Risk Factors/Patient Goals Review   Personal Goals Review Hypertension;Lipids Hypertension;Lipids      Review pt it pleased he is beginning to gain weight.  Recent HTN medication adjustments per Dr. HarwaTerrence Dupont is exercising on his own at home.  pt it pleased he is beginning to gain weight.  Recent HTN medication adjustments  per Dr. Terrence Dupont.  pt is compliant with home exercise.       Expected Outcomes pt will participate in CR exercise, nutrition and lifestyle education opportunities to decrease overall CAD RF.   pt will participate in CR exercise, nutrition and lifestyle education opportunities to decrease overall CAD RF.           Core Components/Risk Factors/Patient Goals at Discharge (Final Review):      Goals and Risk Factor Review - 03/12/17 1115      Core Components/Risk Factors/Patient Goals Review    Personal Goals Review Hypertension;Lipids   Review pt it pleased he is beginning to gain weight.  Recent HTN medication adjustments per Dr. Terrence Dupont.  pt is compliant with home exercise.    Expected Outcomes pt will participate in CR exercise, nutrition and lifestyle education opportunities to decrease overall CAD RF.        ITP Comments:     ITP Comments    Row Name 01/07/17 [phone] 03/11/17 1620         ITP Comments Medical Director, Dr. Fransico Him Medical Director, Dr. Fransico Him         Comments: Pt is making expected progress toward personal goals after completing 21 sessions. Recommend continued exercise and life style modification education including  stress management and relaxation techniques to decrease cardiac risk profile.

## 2017-03-14 ENCOUNTER — Encounter (HOSPITAL_COMMUNITY)
Admission: RE | Admit: 2017-03-14 | Discharge: 2017-03-14 | Disposition: A | Discharge status: Home / Self Care | Payer: BLUE CROSS/BLUE SHIELD | Source: Ambulatory Visit | Attending: Cardiology | Admitting: Cardiology

## 2017-03-14 DIAGNOSIS — Z951 Presence of aortocoronary bypass graft: Secondary | ICD-10-CM | POA: Diagnosis not present

## 2017-03-14 DIAGNOSIS — I2119 ST elevation (STEMI) myocardial infarction involving other coronary artery of inferior wall: Secondary | ICD-10-CM

## 2017-03-17 ENCOUNTER — Encounter (HOSPITAL_COMMUNITY)
Admission: RE | Admit: 2017-03-17 | Discharge: 2017-03-17 | Disposition: A | Discharge status: Home / Self Care | Payer: BLUE CROSS/BLUE SHIELD | Source: Ambulatory Visit | Attending: Cardiology | Admitting: Cardiology

## 2017-03-17 DIAGNOSIS — Z951 Presence of aortocoronary bypass graft: Secondary | ICD-10-CM | POA: Diagnosis not present

## 2017-03-17 DIAGNOSIS — I2119 ST elevation (STEMI) myocardial infarction involving other coronary artery of inferior wall: Secondary | ICD-10-CM

## 2017-03-19 ENCOUNTER — Encounter (HOSPITAL_COMMUNITY)
Admission: RE | Admit: 2017-03-19 | Discharge: 2017-03-19 | Disposition: A | Discharge status: Home / Self Care | Payer: BLUE CROSS/BLUE SHIELD | Source: Ambulatory Visit | Attending: Cardiology | Admitting: Cardiology

## 2017-03-19 DIAGNOSIS — I2119 ST elevation (STEMI) myocardial infarction involving other coronary artery of inferior wall: Secondary | ICD-10-CM

## 2017-03-19 DIAGNOSIS — Z951 Presence of aortocoronary bypass graft: Secondary | ICD-10-CM | POA: Diagnosis not present

## 2017-03-21 ENCOUNTER — Encounter (HOSPITAL_COMMUNITY): Payer: BLUE CROSS/BLUE SHIELD

## 2017-03-24 ENCOUNTER — Encounter (HOSPITAL_COMMUNITY): Payer: BLUE CROSS/BLUE SHIELD

## 2017-03-24 ENCOUNTER — Encounter (HOSPITAL_COMMUNITY)
Admission: RE | Admit: 2017-03-24 | Discharge: 2017-03-24 | Disposition: A | Discharge status: Home / Self Care | Payer: BLUE CROSS/BLUE SHIELD | Source: Ambulatory Visit | Attending: Cardiology | Admitting: Cardiology

## 2017-03-24 DIAGNOSIS — Z951 Presence of aortocoronary bypass graft: Secondary | ICD-10-CM | POA: Diagnosis not present

## 2017-03-28 ENCOUNTER — Encounter (HOSPITAL_COMMUNITY): Payer: BLUE CROSS/BLUE SHIELD

## 2017-03-28 ENCOUNTER — Encounter (HOSPITAL_COMMUNITY)
Admission: RE | Admit: 2017-03-28 | Discharge: 2017-03-28 | Disposition: A | Discharge status: Home / Self Care | Payer: BLUE CROSS/BLUE SHIELD | Source: Ambulatory Visit | Attending: Cardiology | Admitting: Cardiology

## 2017-03-28 DIAGNOSIS — I2119 ST elevation (STEMI) myocardial infarction involving other coronary artery of inferior wall: Secondary | ICD-10-CM

## 2017-03-28 DIAGNOSIS — Z951 Presence of aortocoronary bypass graft: Secondary | ICD-10-CM

## 2017-03-31 ENCOUNTER — Encounter (HOSPITAL_COMMUNITY): Payer: BLUE CROSS/BLUE SHIELD

## 2017-03-31 ENCOUNTER — Encounter (HOSPITAL_COMMUNITY)
Admission: RE | Admit: 2017-03-31 | Discharge: 2017-03-31 | Disposition: A | Discharge status: Home / Self Care | Payer: BLUE CROSS/BLUE SHIELD | Source: Ambulatory Visit | Attending: Cardiology | Admitting: Cardiology

## 2017-03-31 DIAGNOSIS — I2119 ST elevation (STEMI) myocardial infarction involving other coronary artery of inferior wall: Secondary | ICD-10-CM

## 2017-03-31 DIAGNOSIS — Z951 Presence of aortocoronary bypass graft: Secondary | ICD-10-CM | POA: Diagnosis not present

## 2017-04-02 ENCOUNTER — Encounter (HOSPITAL_COMMUNITY)
Admission: RE | Admit: 2017-04-02 | Discharge: 2017-04-02 | Disposition: A | Discharge status: Home / Self Care | Payer: BLUE CROSS/BLUE SHIELD | Source: Ambulatory Visit | Attending: Cardiology | Admitting: Cardiology

## 2017-04-02 ENCOUNTER — Encounter (HOSPITAL_COMMUNITY): Payer: BLUE CROSS/BLUE SHIELD

## 2017-04-02 DIAGNOSIS — Z951 Presence of aortocoronary bypass graft: Secondary | ICD-10-CM

## 2017-04-02 DIAGNOSIS — I2119 ST elevation (STEMI) myocardial infarction involving other coronary artery of inferior wall: Secondary | ICD-10-CM

## 2017-04-04 ENCOUNTER — Encounter (HOSPITAL_COMMUNITY)
Admission: RE | Admit: 2017-04-04 | Discharge: 2017-04-04 | Disposition: A | Discharge status: Home / Self Care | Payer: BLUE CROSS/BLUE SHIELD | Source: Ambulatory Visit | Attending: Cardiology | Admitting: Cardiology

## 2017-04-04 ENCOUNTER — Encounter (HOSPITAL_COMMUNITY): Payer: BLUE CROSS/BLUE SHIELD

## 2017-04-04 DIAGNOSIS — Z951 Presence of aortocoronary bypass graft: Secondary | ICD-10-CM | POA: Diagnosis not present

## 2017-04-04 DIAGNOSIS — I2119 ST elevation (STEMI) myocardial infarction involving other coronary artery of inferior wall: Secondary | ICD-10-CM

## 2017-04-07 ENCOUNTER — Encounter (HOSPITAL_COMMUNITY)
Admission: RE | Admit: 2017-04-07 | Discharge: 2017-04-07 | Disposition: A | Discharge status: Home / Self Care | Payer: BLUE CROSS/BLUE SHIELD | Source: Ambulatory Visit | Attending: Cardiology | Admitting: Cardiology

## 2017-04-07 ENCOUNTER — Encounter (HOSPITAL_COMMUNITY): Payer: BLUE CROSS/BLUE SHIELD

## 2017-04-07 DIAGNOSIS — I2119 ST elevation (STEMI) myocardial infarction involving other coronary artery of inferior wall: Secondary | ICD-10-CM

## 2017-04-07 DIAGNOSIS — Z951 Presence of aortocoronary bypass graft: Secondary | ICD-10-CM

## 2017-04-08 NOTE — Progress Notes (Signed)
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  aspirin EC 81 MG tablet, Take 81 mg by mouth daily., Disp: , Rfl:  .  atorvastatin (LIPITOR) 80 MG tablet, Take 1 tablet (80 mg total) by mouth daily at 6 PM., Disp: 30 tablet, Rfl: 1 .  EPINEPHrine (EPIPEN 2-PAK) 0.3 mg/0.3 mL IJ SOAJ injection, Inject 0.3 mLs (0.3 mg total) into the muscle once., Disp: 1 Device, Rfl: 2 .  folic acid (FOLVITE) 1 MG tablet, Take 1 tablet (1 mg total) by mouth daily., Disp: 30 tablet, Rfl: 1 .  losartan (COZAAR) 25 MG tablet, Take 25 mg by mouth daily., Disp: , Rfl:  .  metoprolol tartrate (LOPRESSOR) 25 MG tablet, Take 1 tablet (25 mg total) by mouth 2 (two) times daily., Disp: 60 tablet, Rfl: 1 .  NITROGLYCERIN SL, Place 1 tablet under the tongue daily as needed., Disp: , Rfl:   Past Medical History: Past Medical History:  Diagnosis Date  . 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 Coronary artery disease   . Elevated prostate specific antigen (PSA)   . History of chicken pox   . 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 Prostate cancer ([place])     Tobacco Use: History  Smoking Status  . Former Smoker  . Packs/day: 1.00  . Years: 40.00  . Types: Cigarettes  . Quit date: 10/12/2016  Smokeless Tobacco  . Never Used    Comment: 1 ppd     Labs: Recent  Review Flowsheet Data    Labs for ITP Cardiac and Pulmonary Rehab Latest Ref Rng & Units 10/19/2016 10/19/2016 10/20/2016 10/20/2016 10/20/2016   Cholestrol 0 - 200 mg/dL - - - - -   LDLCALC 0 - 99 mg/dL - - - - -   HDL >40 mg/dL - - - - -   Trlycerides <150 mg/dL - - - - -   Hemoglobin A1c 4.8 - 5.6 % - - - - -   PHART 7.350 - 7.450 7.337(L) 7.342(L) 7.403 7.364 -   PCO2ART 32.0 - 48.0 mmHg 45.2 46.0 36.6 41.3 -   HCO3 20.0 - 28.0 mmol/L 24.2 24.9 22.6 23.4 -   TCO2 0 - 100 mmol/L '26 26 24 25 23   '$ ACIDBASEDEF 0.0 - 2.0 mmol/L 2.0 1.0 2.0 2.0 -   O2SAT % 100.0 100.0 98.0 97.0 -      Capillary Blood Glucose: Lab Results  Component Value Date   GLUCAP 97 10/24/2016   GLUCAP 115 (H) 10/23/2016   GLUCAP 107 (H) 10/23/2016   GLUCAP 114 (H) 10/23/2016   GLUCAP 98 10/23/2016     Exercise Target Goals:    Exercise Program Goal: Individual exercise prescription set with THRR, safety & activity barriers. Participant demonstrates ability to understand and report RPE using BORG scale, to self-measure pulse accurately, and to  acknowledge the importance of the exercise prescription.  Exercise Prescription Goal: Starting with aerobic activity 30 plus minutes a day, 3 days per week for initial exercise prescription. Provide home exercise prescription and guidelines that participant acknowledges understanding prior to discharge.  Activity Barriers & Risk Stratification:     Activity Barriers & Cardiac Risk Stratification - 01/07/17 0836      Activity Barriers & Cardiac Risk Stratification   Activity Barriers Other (comment)   Comments L shoulder [facility] ~25years ago   Cardiac Risk Stratification High      6 Minute Walk:     6 Minute Walk    Row Name 01/07/17 1114         6 Minute Walk   Phase Initial     Distance 1411 feet     Walk Time 6 minutes     # of Rest Breaks 0     MPH 2.67     METS 3.48     RPE 11     VO2 Peak 12.18     Symptoms No     Resting HR 73 bpm      Resting BP 104/70     Max Ex. HR 93 bpm     Max Ex. BP 108/70     2 Minute Post BP 106/70        Oxygen Initial Assessment:   Oxygen Re-Evaluation:   Oxygen Discharge (Final Oxygen Re-Evaluation):   Initial Exercise Prescription:     Initial Exercise Prescription - 01/07/17 1200      Date of Initial Exercise RX and Referring Provider   Date 01/07/17   Referring Provider Charolette Forward, MD     Bike   Level 0.7   Minutes 10   METs 3.4     NuStep   Level 3   SPM 85   Minutes 10   METs 3     Track   Laps 12   Minutes 10   METs 3.09     Prescription Details   Frequency (times per week) 3   Duration Progress to 30 minutes of continuous aerobic without signs/symptoms of physical distress     Intensity   THRR 40-80% of Max Heartrate 62-125   Ratings of Perceived Exertion 11-13   Perceived Dyspnea 0-4     Progression   Progression Continue to progress workloads to maintain intensity without signs/symptoms of physical distress.     Resistance Training   Training Prescription Yes   Weight 3lb   Reps 10-15      Perform Capillary Blood Glucose checks as needed.  Exercise Prescription Changes:     Exercise Prescription Changes    Row Name 01/13/17 1600 01/27/17 1000 02/11/17 1600 02/25/17 1100 03/17/17 1617     Response to Exercise   Blood Pressure (Admit) 108/70 114/70 108/60 104/60 112/64   Blood Pressure (Exercise) 114/70 120/78 124/60 108/60 124/70   Blood Pressure (Exit) 110/72 100/70 104/62 110/62 114/60   Heart Rate (Admit) 79 bpm 76 bpm 72 bpm 70 bpm 90 bpm   Heart Rate (Exercise) 92 bpm 103 bpm 111 bpm 106 bpm 122 bpm   Heart Rate (Exit) 75 bpm 76 bpm 76 bpm 69 bpm 77 bpm   Rating of Perceived Exertion (Exercise) '12 12 13 13 12   '$ Duration Continue with 30 min of aerobic exercise without signs/symptoms of physical distress. Continue with 30 min of aerobic exercise without signs/symptoms of physical distress. Continue with 30 min of aerobic  exercise  without signs/symptoms of physical distress. Continue with 30 min of aerobic exercise without signs/symptoms of physical distress. Continue with 30 min of aerobic exercise without signs/symptoms of physical distress.   Intensity THRR unchanged THRR unchanged THRR unchanged THRR unchanged THRR unchanged     Progression   Progression Continue to progress workloads to maintain intensity without signs/symptoms of physical distress. Continue to progress workloads to maintain intensity without signs/symptoms of physical distress. Continue to progress workloads to maintain intensity without signs/symptoms of physical distress. Continue to progress workloads to maintain intensity without signs/symptoms of physical distress. Continue to progress workloads to maintain intensity without signs/symptoms of physical distress.   Average METs 2.9 3.6 3.7 3.9 3.8     Resistance Training   Training Prescription Yes Yes Yes Yes Yes   Weight 3lb 3lb 4lbs 4lbs 5lbs   Reps 10-15 10-15 10-15 10-15 10-15   Time 10 Minutes 10 Minutes 10 Minutes 10 Minutes 10 Minutes     Bike   Level 0.7 0.8  -  -  -   Minutes 10 10  -  -  -   METs 3.38 3.74  -  -  -     Recumbant Bike   Level  -  - 3.'5 4 4   '$ Minutes  -  - '10 10 10   '$ METs  -  - 3.7 4.3 3.9     NuStep   Level '3 4 4 4  '$ -   SPM 85 90 90 90  -   Minutes '10 10 10 10  '$ -   METs 2.4 3.7 4 4.3 4.1     Track   Laps '12 14 14 15 16   '$ Minutes '10 10 10 10 10   '$ METs 3.09 3.43 3.43 3.6 3.74     Home Exercise Plan   Plans to continue exercise at  - Home (comment)  walking Home (comment)  walking Home (comment)  walking Home (comment)  walking   Frequency  - Add 3 additional days to program exercise sessions. Add 3 additional days to program exercise sessions. Add 3 additional days to program exercise sessions. Add 3 additional days to program exercise sessions.   Initial Home Exercises Provided  - 01/27/17 01/27/17 01/27/17 01/27/17   Row Name 03/31/17 1600              Response to Exercise   Blood Pressure (Admit) 108/60       Blood Pressure (Exercise) 110/58       Blood Pressure (Exit) 118/60       Heart Rate (Admit) 71 bpm       Heart Rate (Exercise) 111 bpm       Heart Rate (Exit) 71 bpm       Rating of Perceived Exertion (Exercise) 13       Duration Continue with 30 min of aerobic exercise without signs/symptoms of physical distress.       Intensity THRR unchanged         Progression   Progression Continue to progress workloads to maintain intensity without signs/symptoms of physical distress.       Average METs 4         Resistance Training   Training Prescription Yes       Weight 5lbs       Reps 10-15       Time 10 Minutes         Recumbant Bike   Level 4  Minutes 10       METs 3.9         NuStep   Level 4       SPM 80       Minutes 10       METs 3.7         Rower   Level 3       Watts 39       Minutes 10       METs 4.5         Home Exercise Plan   Plans to continue exercise at Home (comment)  walking       Frequency Add 3 additional days to program exercise sessions.       Initial Home Exercises Provided 01/27/17          Exercise Comments:     Exercise Comments    Row Name 01/14/17 1420 02/05/17 1523 02/11/17 1612 03/10/17 1053 03/31/17 1615   Exercise Comments Pt responded to first exercise session very well. Pt exercise for 30 minutes without s/s of CP, dizziness or SOB.  Reviewed METs and goals. Pt is tolerating exercise very well; will continue to monitor exercise progression. Reviewed METs and goals. Pt is tolerating exercise very well; will continue to monitor exercise progression. Reviewed METs and goals. Pt is tolerating exercise very well; will continue to monitor exercise progression. Reviewed METs and goals. Pt is tolerating exercise very well; will continue to monitor exercise progression.      Exercise Goals and Review:     Exercise Goals    Row Name 01/07/17 [phone]              Exercise Goals   Increase Physical Activity Yes       Intervention Provide advice, education, support and counseling about physical activity/exercise needs.;Develop an individualized exercise prescription for aerobic and resistive training based on initial evaluation findings, risk stratification, comorbidities and participant's personal goals.       Expected Outcomes Achievement of increased cardiorespiratory fitness and enhanced flexibility, muscular endurance and strength shown through measurements of functional capacity and personal statement of participant.       Increase Strength and Stamina Yes  Return to normal activities       Intervention Develop an individualized exercise prescription for aerobic and resistive training based on initial evaluation findings, risk stratification, comorbidities and participant's personal goals.;Provide advice, education, support and counseling about physical activity/exercise needs.       Expected Outcomes Achievement of increased cardiorespiratory fitness and enhanced flexibility, muscular endurance and strength shown through measurements of functional capacity and personal statement of participant.          Exercise Goals Re-Evaluation :     Exercise Goals Re-Evaluation    Row Name 01/27/17 0855 03/10/17 1052 04/03/17 1343 04/03/17 1344       Exercise Goal Re-Evaluation   Exercise Goals Review Increase Physical Activity;Increase Strenth and Stamina Increase Physical Activity;Increase Strenth and Stamina  -  -    Comments eviewed home exercise with pt today.  Pt plans to walk for exercise, 3x/week in addition to coming to cardiac rehab.  Reviewed THR, pulse, RPE, sign and symptoms, and when to call 911 or MD.  Also discussed weather considerations and indoor options.  Pt voiced understanding. Pt is tolerating exercise very well; pt has increased MET average on Nustep and overall avg METs. Pt is compliant with home exercise. Pt has returned to all  activities at home Pt is tolerating  exercise WL very well and noticed an increase in UE strength since using the rower machine. Pt  Pt is tolerating exercise WL very well and noticed an increase in UE strength since using the rower machine. Pt is also very active at home and compliant with HEP.    Expected Outcomes Pt willbe compliant with HEP and continue to improve in cardiorespiratory fitness Pt will be compliant with HEP and continue to improve in cardiorespiratory fitness  - Pt will be compliant with HEP and continue to improve in cardiorespiratory fitness        Discharge Exercise Prescription (Final Exercise Prescription Changes):     Exercise Prescription Changes - 03/31/17 1600      Response to Exercise   Blood Pressure (Admit) 108/60   Blood Pressure (Exercise) 110/58   Blood Pressure (Exit) 118/60   Heart Rate (Admit) 71 bpm   Heart Rate (Exercise) 111 bpm   Heart Rate (Exit) 71 bpm   Rating of Perceived Exertion (Exercise) 13   Duration Continue with 30 min of aerobic exercise without signs/symptoms of physical distress.   Intensity THRR unchanged     Progression   Progression Continue to progress workloads to maintain intensity without signs/symptoms of physical distress.   Average METs 4     Resistance Training   Training Prescription Yes   Weight 5lbs   Reps 10-15   Time 10 Minutes     Recumbant Bike   Level 4   Minutes 10   METs 3.9     NuStep   Level 4   SPM 80   Minutes 10   METs 3.7     Rower   Level 3   Watts 39   Minutes 10   METs 4.5     Home Exercise Plan   Plans to continue exercise at Home (comment)  walking   Frequency Add 3 additional days to program exercise sessions.   Initial Home Exercises Provided 01/27/17      Nutrition:  Target Goals: Understanding of nutrition guidelines, daily intake of sodium '1500mg'$ , cholesterol '200mg'$ , calories 30% from fat and 7% or less from saturated fats, daily to have 5 or more servings of fruits  and vegetables.  Biometrics:     Pre Biometrics - 01/07/17 1113      Pre Biometrics   Height '5\' 4"'$  (1.626 m)   Weight 122 lb 9.2 oz (55.6 kg)   Waist Circumference 32 inches   Hip Circumference 34 inches   Waist to Hip Ratio 0.94 %   BMI (Calculated) 21.1   Triceps Skinfold 8 mm   % Body Fat 19.1 %   Grip Strength 23 kg   Flexibility 12.5 in   Single Leg Stand 20.87 seconds       Nutrition Therapy Plan and Nutrition Goals:     Nutrition Therapy & Goals - 02/03/17 1001      Nutrition Therapy   Diet High Calorie, High Protein until UBW range achieved     Personal Nutrition Goals   Nutrition Goal Pt to gain wt to UBW range of 135-140 lb at graduation from Cardiac Rehab.      Intervention Plan   Intervention Prescribe, educate and counsel regarding individualized specific dietary modifications aiming towards targeted core components such as weight, hypertension, lipid management, diabetes, heart failure and other comorbidities.   Expected Outcomes Short Term Goal: Understand basic principles of dietary content, such as calories, fat, sodium, cholesterol and nutrients.;Long Term Goal: Adherence to prescribed nutrition  plan.      Nutrition Discharge: Nutrition Scores:     Nutrition Assessments - 02/03/17 1001      MEDFICTS Scores   Pre Score 45      Nutrition Goals Re-Evaluation:   Nutrition Goals Re-Evaluation:   Nutrition Goals Discharge (Final Nutrition Goals Re-Evaluation):   Psychosocial: Target Goals: Acknowledge presence or absence of significant depression and/or stress, maximize coping skills, provide positive support system. Participant is able to verbalize types and ability to use techniques and skills needed for reducing stress and depression.  Initial Review & Psychosocial Screening:     Initial Psych Review & Screening - 01/07/17 1112      Initial Review   Current issues with None Identified     Family Dynamics   Good Support System? Yes   family, church      Barriers   Psychosocial barriers to participate in program There are no identifiable barriers or psychosocial needs.     Screening Interventions   Interventions Encouraged to exercise;Provide feedback about the scores to participant      Quality of Life Scores:     Quality of Life - 01/07/17 1037      Quality of Life Scores   Health/Function Pre 25.77 %   Socioeconomic Pre 24.29 %   Psych/Spiritual Pre 28.57 %   Family Pre 30 %   GLOBAL Pre 26.56 %      PHQ-9: Recent Review Flowsheet Data    Depression screen [facility] 2/9 01/13/2017 02/29/2016   Decreased Interest 0 0   Down, Depressed, Hopeless 0 0   PHQ - 2 Score 0 0     Interpretation of Total Score  Total Score Depression Severity:  1-4 = Minimal depression, 5-9 = Mild depression, 10-14 = Moderate depression, 15-19 = Moderately severe depression, 20-27 = Severe depression   Psychosocial Evaluation and Intervention:     Psychosocial Evaluation - 01/13/17 1650      Psychosocial Evaluation & Interventions   Interventions Encouraged to exercise with the program and follow exercise prescription   Comments no psychosocial needs identified, no interventions necessary    Expected Outcomes pt will exhibit positive outlook with good coping skills.    Continue Psychosocial Services  No Follow up required      Psychosocial Re-Evaluation:     Psychosocial Re-Evaluation    [place] Name 02/11/17 0931 03/12/17 1116 04/02/17 1708         Psychosocial Re-Evaluation   Current issues with None Identified None Identified None Identified     Comments no psychosocial needs identified, no interventions necessary no psychosocial needs identified, no interventions necessary no psychosocial needs identified, no interventions necessary     Expected Outcomes pt will exhibit positive outlook with good coping skills.  pt will exhibit positive outlook with good coping skills.  pt will exhibit positive outlook with good  coping skills.      Interventions Encouraged to attend Cardiac Rehabilitation for the exercise Encouraged to attend Cardiac Rehabilitation for the exercise Encouraged to attend Cardiac Rehabilitation for the exercise     Continue Psychosocial Services  No Follow up required No Follow up required No Follow up required        Psychosocial Discharge (Final Psychosocial Re-Evaluation):     Psychosocial Re-Evaluation - 04/02/17 1708      Psychosocial Re-Evaluation   Current issues with None Identified   Comments no psychosocial needs identified, no interventions necessary   Expected Outcomes pt will exhibit positive outlook with good coping  skills.    Interventions Encouraged to attend Cardiac Rehabilitation for the exercise   Continue Psychosocial Services  No Follow up required      Vocational Rehabilitation: Provide vocational rehab assistance to qualifying candidates.   Vocational Rehab Evaluation & Intervention:     Vocational Rehab - 01/07/17 1112      Initial Vocational Rehab Evaluation & Intervention   Assessment shows need for Vocational Rehabilitation No      Education: Education Goals: Education classes will be provided on a weekly basis, covering required topics. Participant will state understanding/return demonstration of topics presented.  Learning Barriers/Preferences:     Learning Barriers/Preferences - 01/07/17 0836      Learning Barriers/Preferences   Learning Barriers None   Learning Preferences Written Material;Skilled Demonstration      Education Topics: Count Your Pulse:  -Group instruction provided by verbal instruction, demonstration, patient participation and written materials to support subject.  Instructors address importance of being able to find your pulse and how to count your pulse when at home without a heart monitor.  Patients get hands on experience counting their pulse with staff help and individually.   CARDIAC REHAB PHASE II EXERCISE  from 03/19/2017 in [place]  Date  01/24/17  Instruction Review Code  2- meets goals/outcomes      Heart Attack, Angina, and Risk Factor Modification:  -Group instruction provided by verbal instruction, video, and written materials to support subject.  Instructors address signs and symptoms of angina and heart attacks.    Also discuss risk factors for heart disease and how to make changes to improve heart health risk factors.   CARDIAC REHAB PHASE II EXERCISE from 03/19/2017 in [place]  Date  02/12/17  Instruction Review Code  2- meets goals/outcomes      Functional Fitness:  -Group instruction provided by verbal instruction, demonstration, patient participation, and written materials to support subject.  Instructors address safety measures for doing things around the house.  Discuss how to get up and down off the floor, how to pick things up properly, how to safely get out of a chair without assistance, and balance training.   Meditation and Mindfulness:  -Group instruction provided by verbal instruction, patient participation, and written materials to support subject.  Instructor addresses importance of mindfulness and meditation practice to help reduce stress and improve awareness.  Instructor also leads participants through a meditation exercise.    Stretching for Flexibility and Mobility:  -Group instruction provided by verbal instruction, patient participation, and written materials to support subject.  Instructors lead participants through series of stretches that are designed to increase flexibility thus improving mobility.  These stretches are additional exercise for major muscle groups that are typically performed during regular warm up and cool down.   CARDIAC REHAB PHASE II EXERCISE from 03/19/2017 in [place]  Date  03/14/17  Instruction Review Code  2- meets goals/outcomes       Hands Only CPR:  -Group verbal, video, and participation provides a basic overview of AHA guidelines for community CPR. Role-play of emergencies allow participants the opportunity to practice calling for help and chest compression technique with discussion of AED use.   Hypertension: -Group verbal and written instruction that provides a basic overview of hypertension including the most recent diagnostic guidelines, risk factor reduction with self-care instructions and medication management.    Nutrition I class: Heart Healthy Eating:  -Group instruction provided by PowerPoint  slides, verbal discussion, and written materials to support subject matter. The instructor gives an explanation and review of the Therapeutic Lifestyle Changes diet recommendations, which includes a discussion on lipid goals, dietary fat, sodium, fiber, plant stanol/sterol esters, sugar, and the components of a well-balanced, healthy diet.   CARDIAC REHAB PHASE II EXERCISE from 03/19/2017 in [place]  Date  02/03/17  Educator  RD  Instruction Review Code  Not applicable [class handouts given]      Nutrition II class: Lifestyle Skills:  -Group instruction provided by PowerPoint slides, verbal discussion, and written materials to support subject matter. The instructor gives an explanation and review of label reading, grocery shopping for heart health, heart healthy recipe modifications, and ways to make healthier choices when eating out.   CARDIAC REHAB PHASE II EXERCISE from 03/19/2017 in [place]  Date  02/03/17  Educator  RD  Instruction Review Code  Not applicable [class handouts given]      Diabetes Question & Answer:  -Group instruction provided by PowerPoint slides, verbal discussion, and written materials to support subject matter. The instructor gives an explanation and review of diabetes co-morbidities, pre- and post-prandial blood  glucose goals, pre-exercise blood glucose goals, signs, symptoms, and treatment of hypoglycemia and hyperglycemia, and foot care basics.   CARDIAC REHAB PHASE II EXERCISE from 03/19/2017 in [place]  Date  01/31/17  Educator  RD  Instruction Review Code  2- meets goals/outcomes      Diabetes Blitz:  -Group instruction provided by PowerPoint slides, verbal discussion, and written materials to support subject matter. The instructor gives an explanation and review of the physiology behind type 1 and type 2 diabetes, diabetes medications and rational behind using different medications, pre- and post-prandial blood glucose recommendations and Hemoglobin A1c goals, diabetes diet, and exercise including blood glucose guidelines for exercising safely.    Portion Distortion:  -Group instruction provided by PowerPoint slides, verbal discussion, written materials, and food models to support subject matter. The instructor gives an explanation of serving size versus portion size, changes in portions sizes over the last 20 years, and what consists of a serving from each food group.   CARDIAC REHAB PHASE II EXERCISE from 03/19/2017 in [place]  Date  01/16/17  Educator  RD  Instruction Review Code  2- meets goals/outcomes      Stress Management:  -Group instruction provided by verbal instruction, video, and written materials to support subject matter.  Instructors review role of stress in heart disease and how to cope with stress positively.     CARDIAC REHAB PHASE II EXERCISE from 03/19/2017 in [place]  Date  03/19/17  Instruction Review Code  2- meets goals/outcomes      Exercising on Your Own:  -Group instruction provided by verbal instruction, power point, and written materials to support subject.  Instructors discuss benefits of exercise, components of exercise, frequency and intensity of exercise,  and end points for exercise.  Also discuss use of nitroglycerin and activating EMS.  Review options of places to exercise outside of rehab.  Review guidelines for sex with heart disease.   Cardiac Drugs I:  -Group instruction provided by verbal instruction and written materials to support subject.  Instructor reviews cardiac drug classes: antiplatelets, anticoagulants, beta blockers, and statins.  Instructor discusses reasons, side effects, and lifestyle considerations for each drug class.   Cardiac Drugs II:  -  Group instruction provided by verbal instruction and written materials to support subject.  Instructor reviews cardiac drug classes: angiotensin converting enzyme inhibitors (ACE-I), angiotensin II receptor blockers (ARBs), nitrates, and calcium channel blockers.  Instructor discusses reasons, side effects, and lifestyle considerations for each drug class.   CARDIAC REHAB PHASE II EXERCISE from 03/19/2017 in [place]  Date  03/05/17  Educator  Kennyth Lose  Instruction Review Code  2- meets goals/outcomes      Anatomy and Physiology of the Circulatory System:  Group verbal and written instruction and models provide basic cardiac anatomy and physiology, with the coronary electrical and arterial systems. Review of: AMI, Angina, Valve disease, Heart Failure, Peripheral Artery Disease, Cardiac Arrhythmia, Pacemakers, and the ICD.   CARDIAC REHAB PHASE II EXERCISE from 03/19/2017 in [place]  Date  02/19/17  Instruction Review Code  2- meets goals/outcomes      Other Education:  -Group or individual verbal, written, or video instructions that support the educational goals of the cardiac rehab program.   Knowledge Questionnaire Score:     Knowledge Questionnaire Score - 01/07/17 1037      Knowledge Questionnaire Score   Pre Score 20/24      Core Components/Risk Factors/Patient Goals at Admission:     Personal Goals  and Risk Factors at Admission - 01/07/17 1231      Core Components/Risk Factors/Patient Goals on Admission   Hypertension Yes   Intervention Provide education on lifestyle modifcations including regular physical activity/exercise, weight management, moderate sodium restriction and increased consumption of fresh fruit, vegetables, and low fat dairy, alcohol moderation, and smoking cessation.;Monitor prescription use compliance.   Expected Outcomes Long Term: Maintenance of blood pressure at goal levels.;Short Term: Continued assessment and intervention until BP is < 140/44m HG in hypertensive participants. < 130/833mHG in hypertensive participants with diabetes, heart failure or chronic kidney disease.   Lipids Yes   Intervention Provide education and support for participant on nutrition & aerobic/resistive exercise along with prescribed medications to achieve LDL '70mg'$ , HDL >'40mg'$ .   Expected Outcomes Short Term: Participant states understanding of desired cholesterol values and is compliant with medications prescribed. Participant is following exercise prescription and nutrition guidelines.;Long Term: Cholesterol controlled with medications as prescribed, with individualized exercise RX and with personalized nutrition plan. Value goals: LDL < '70mg'$ , HDL > 40 mg.      Core Components/Risk Factors/Patient Goals Review:      Goals and Risk Factor Review    Row Name 02/11/17 0988416/20/18 1115 04/02/17 1707         Core Components/Risk Factors/Patient Goals Review   Personal Goals Review Hypertension;Lipids Hypertension;Lipids Hypertension;Lipids     Review pt it pleased he is beginning to gain weight.  Recent HTN medication adjustments per Dr. HaTerrence Dupont pt is exercising on his own at home.  pt it pleased he is beginning to gain weight.  Recent HTN medication adjustments per Dr. HaTerrence Dupont pt is compliant with home exercise.  pt it pleased with his current progress, especially healthier weight and  improved BP readings.  pt is compliant with home exercise.      Expected Outcomes pt will participate in CR exercise, nutrition and lifestyle education opportunities to decrease overall CAD RF.   pt will participate in CR exercise, nutrition and lifestyle education opportunities to decrease overall CAD RF.   pt will participate in CR exercise, nutrition and lifestyle education opportunities to decrease overall CAD RF.  Core Components/Risk Factors/Patient Goals at Discharge (Final Review):      Goals and Risk Factor Review - 04/02/17 1707      Core Components/Risk Factors/Patient Goals Review   Personal Goals Review Hypertension;Lipids   Review pt it pleased with his current progress, especially healthier weight and improved BP readings.  pt is compliant with home exercise.    Expected Outcomes pt will participate in CR exercise, nutrition and lifestyle education opportunities to decrease overall CAD RF.        ITP Comments:     ITP Comments    Row Name 01/07/17 9292 03/11/17 1620 04/07/17 1137       ITP Comments Medical Director, Dr. Fransico Him Medical Director, Dr. Fransico Him Medical Director, Dr. Fransico Him        Comments: Pt is making expected progress toward personal goals after completing 30 sessions. Recommend continued exercise and life style modification education including  stress management and relaxation techniques to decrease cardiac risk profile.

## 2017-04-09 ENCOUNTER — Encounter (HOSPITAL_COMMUNITY)
Admission: RE | Admit: 2017-04-09 | Discharge: 2017-04-09 | Disposition: A | Discharge status: Home / Self Care | Payer: BLUE CROSS/BLUE SHIELD | Source: Ambulatory Visit | Attending: Cardiology | Admitting: Cardiology

## 2017-04-09 ENCOUNTER — Encounter (HOSPITAL_COMMUNITY): Payer: BLUE CROSS/BLUE SHIELD

## 2017-04-09 DIAGNOSIS — I2119 ST elevation (STEMI) myocardial infarction involving other coronary artery of inferior wall: Secondary | ICD-10-CM

## 2017-04-09 DIAGNOSIS — Z951 Presence of aortocoronary bypass graft: Secondary | ICD-10-CM | POA: Diagnosis not present

## 2017-04-11 ENCOUNTER — Encounter (HOSPITAL_COMMUNITY): Payer: BLUE CROSS/BLUE SHIELD

## 2017-04-11 ENCOUNTER — Encounter (HOSPITAL_COMMUNITY)
Admission: RE | Admit: 2017-04-11 | Discharge: 2017-04-11 | Disposition: A | Discharge status: Home / Self Care | Payer: BLUE CROSS/BLUE SHIELD | Source: Ambulatory Visit | Attending: Cardiology | Admitting: Cardiology

## 2017-04-11 DIAGNOSIS — Z951 Presence of aortocoronary bypass graft: Secondary | ICD-10-CM

## 2017-04-11 DIAGNOSIS — I2119 ST elevation (STEMI) myocardial infarction involving other coronary artery of inferior wall: Secondary | ICD-10-CM

## 2017-04-14 ENCOUNTER — Encounter (HOSPITAL_COMMUNITY)
Admission: RE | Admit: 2017-04-14 | Discharge: 2017-04-14 | Disposition: A | Discharge status: Home / Self Care | Payer: BLUE CROSS/BLUE SHIELD | Source: Ambulatory Visit | Attending: Cardiology | Admitting: Cardiology

## 2017-04-14 ENCOUNTER — Encounter (HOSPITAL_COMMUNITY): Payer: BLUE CROSS/BLUE SHIELD

## 2017-04-14 DIAGNOSIS — Z951 Presence of aortocoronary bypass graft: Secondary | ICD-10-CM

## 2017-04-14 DIAGNOSIS — I2119 ST elevation (STEMI) myocardial infarction involving other coronary artery of inferior wall: Secondary | ICD-10-CM

## 2017-04-16 ENCOUNTER — Encounter (HOSPITAL_COMMUNITY): Payer: BLUE CROSS/BLUE SHIELD

## 2017-04-16 ENCOUNTER — Encounter (HOSPITAL_COMMUNITY)
Admission: RE | Admit: 2017-04-16 | Discharge: 2017-04-16 | Disposition: A | Discharge status: Home / Self Care | Payer: BLUE CROSS/BLUE SHIELD | Source: Ambulatory Visit | Attending: Cardiology | Admitting: Cardiology

## 2017-04-16 DIAGNOSIS — Z951 Presence of aortocoronary bypass graft: Secondary | ICD-10-CM | POA: Diagnosis not present

## 2017-04-16 DIAGNOSIS — I2119 ST elevation (STEMI) myocardial infarction involving other coronary artery of inferior wall: Secondary | ICD-10-CM

## 2017-04-18 ENCOUNTER — Encounter (HOSPITAL_COMMUNITY)
Admission: RE | Admit: 2017-04-18 | Discharge: 2017-04-18 | Disposition: A | Discharge status: Home / Self Care | Payer: BLUE CROSS/BLUE SHIELD | Source: Ambulatory Visit | Attending: Cardiology | Admitting: Cardiology

## 2017-04-18 ENCOUNTER — Encounter (HOSPITAL_COMMUNITY): Payer: BLUE CROSS/BLUE SHIELD

## 2017-04-18 VITALS — Ht 64.0 in | Wt 126.5 lb

## 2017-04-18 DIAGNOSIS — Z951 Presence of aortocoronary bypass graft: Secondary | ICD-10-CM

## 2017-04-18 DIAGNOSIS — I2119 ST elevation (STEMI) myocardial infarction involving other coronary artery of inferior wall: Secondary | ICD-10-CM

## 2017-04-21 ENCOUNTER — Encounter (HOSPITAL_COMMUNITY): Payer: BLUE CROSS/BLUE SHIELD

## 2017-04-21 ENCOUNTER — Encounter (HOSPITAL_COMMUNITY): Payer: Self-pay

## 2017-04-23 ENCOUNTER — Encounter (HOSPITAL_COMMUNITY): Payer: BLUE CROSS/BLUE SHIELD

## 2017-04-25 ENCOUNTER — Encounter (HOSPITAL_COMMUNITY): Payer: BLUE CROSS/BLUE SHIELD

## 2017-04-28 ENCOUNTER — Encounter (HOSPITAL_COMMUNITY): Payer: BLUE CROSS/BLUE SHIELD

## 2017-04-28 ENCOUNTER — Encounter (HOSPITAL_COMMUNITY): Payer: Self-pay | Admitting: Cardiac Rehabilitation

## 2017-04-28 NOTE — Progress Notes (Signed)
Discharge Summary  Patient Details  Name: Noah Cochran MRN: [id number] Date of Birth: [birth year] Referring Provider:     [place] from 01/07/2017 in [place]  Referring Provider  Charolette Forward, MD       Number of Visits: 36   Reason for Discharge:  Patient independent in their exercise.  Smoking History:  History  Smoking Status  . Former Smoker  . Packs/day: 1.00  . Years: 40.00  . Types: Cigarettes  . Quit date: 10/12/2016  Smokeless Tobacco  . Never Used    Comment: 1 ppd     Diagnosis:  No diagnosis found.  ADL UCSD:   Initial Exercise Prescription:   Discharge Exercise Prescription (Final Exercise Prescription Changes):     Exercise Prescription Changes - 04/18/17 1000      Response to Exercise   Blood Pressure (Admit) 128/70   Blood Pressure (Exercise) 120/66   Blood Pressure (Exit) 120/74   Heart Rate (Admit) 70 bpm   Heart Rate (Exercise) 111 bpm   Heart Rate (Exit) 70 bpm   Rating of Perceived Exertion (Exercise) 13   Duration Continue with 30 min of aerobic exercise without signs/symptoms of physical distress.   Intensity THRR unchanged     Progression   Progression Continue to progress workloads to maintain intensity without signs/symptoms of physical distress.   Average METs 4.6     Resistance Training   Training Prescription Yes   Weight 6lbs   Reps 10-15   Time 10 Minutes     Recumbant Bike   Level 4.5   Minutes 10   METs 4.5     NuStep   Level 4   SPM 90   Minutes 10   METs 4.4     Rower   Level 3   Watts 45   Minutes 10   METs 5     Home Exercise Plan   Plans to continue exercise at Home (comment)  walking   Frequency Add 3 additional days to program exercise sessions.   Initial Home Exercises Provided 01/27/17      Functional Capacity:     6 Minute Walk    Row Name 04/18/17 1520         6 Minute Walk   Phase Discharge     Distance 1684 feet      Walk Time 6 minutes     # of Rest Breaks 0     MPH 3.19     METS 3.98     RPE 11     VO2 Peak 13.94     Symptoms No     Resting HR 62 bpm     Resting BP 116/60     Max Ex. HR 91 bpm     Max Ex. BP 120/72     2 Minute Post BP 116/72        Psychological, QOL, Others - Outcomes: PHQ 2/9: Depression screen [facility] 2/9 04/21/2017 01/13/2017 02/29/2016  Decreased Interest 0 0 0  Down, Depressed, Hopeless 0 0 0  PHQ - 2 Score 0 0 0    Quality of Life:     Quality of Life - 04/14/17 0944      Quality of Life Scores   Health/Function Pre 25.77 %   Health/Function Post 23.07 %   Health/Function % Change -10.48 %   Socioeconomic Pre 24.29 %   Socioeconomic Post 20.29 %   Socioeconomic % Change  -16.47 %  Psych/Spiritual Pre 28.57 %   Psych/Spiritual Post 25.21 %   Psych/Spiritual % Change -11.76 %   Family Pre 30 %   Family Post 28.5 %   Family % Change -5 %   GLOBAL Pre 26.56 %   GLOBAL Post 23.59 %   GLOBAL % Change -11.18 %      Personal Goals: Goals established at orientation with interventions provided to work toward goal.    Personal Goals Discharge:     Goals and Risk Factor Review    Row Name 03/12/17 1115 04/02/17 1707 04/21/17 1656         Core Components/Risk Factors/Patient Goals Review   Personal Goals Review Hypertension;Lipids Hypertension;Lipids Weight Management/Obesity     Review pt it pleased he is beginning to gain weight.  Recent HTN medication adjustments per Dr. Terrence Dupont.  pt is compliant with home exercise.  pt it pleased with his current progress, especially healthier weight and improved BP readings.  pt is compliant with home exercise.  Pt gained 3.1 lb, which is desired.      Expected Outcomes pt will participate in CR exercise, nutrition and lifestyle education opportunities to decrease overall CAD RF.   pt will participate in CR exercise, nutrition and lifestyle education opportunities to decrease overall CAD RF.   Pt to continue to work  toward wt gain goal of 135-140 lb.         Nutrition & Weight - Outcomes:      Post Biometrics - 04/18/17 1521       Post  Biometrics   Height 5\' 4"  (1.626 m)   Weight 126 lb 8.7 oz (57.4 kg)   Waist Circumference 33 inches   Hip Circumference 34 inches   Waist to Hip Ratio 0.97 %   BMI (Calculated) 21.8   Triceps Skinfold 8 mm   % Body Fat 19.8 %   Grip Strength 30 kg   Flexibility 15.25 in   Single Leg Stand 30 seconds      Nutrition:   Nutrition Discharge:     Nutrition Assessments - 04/28/17 0956      MEDFICTS Scores   Pre Score 45   Post Score 39   Score Difference -6      Education Questionnaire Score:     Knowledge Questionnaire Score - 04/14/17 0944      Knowledge Questionnaire Score   Post Score 20/24      Goals reviewed with patient; copy given to patient.

## 2017-04-30 ENCOUNTER — Encounter (HOSPITAL_COMMUNITY): Payer: BLUE CROSS/BLUE SHIELD

## 2017-05-01 IMAGING — CR DG CHEST 2V
2 series · 2 of 2 positions shown · non-contrast
Comparison: New PA and lateral chest x-ray [DATE]

CLINICAL DATA: Status post CABG on October 19, 2016

EXAM:
CHEST  2 VIEW

[w chest pa]
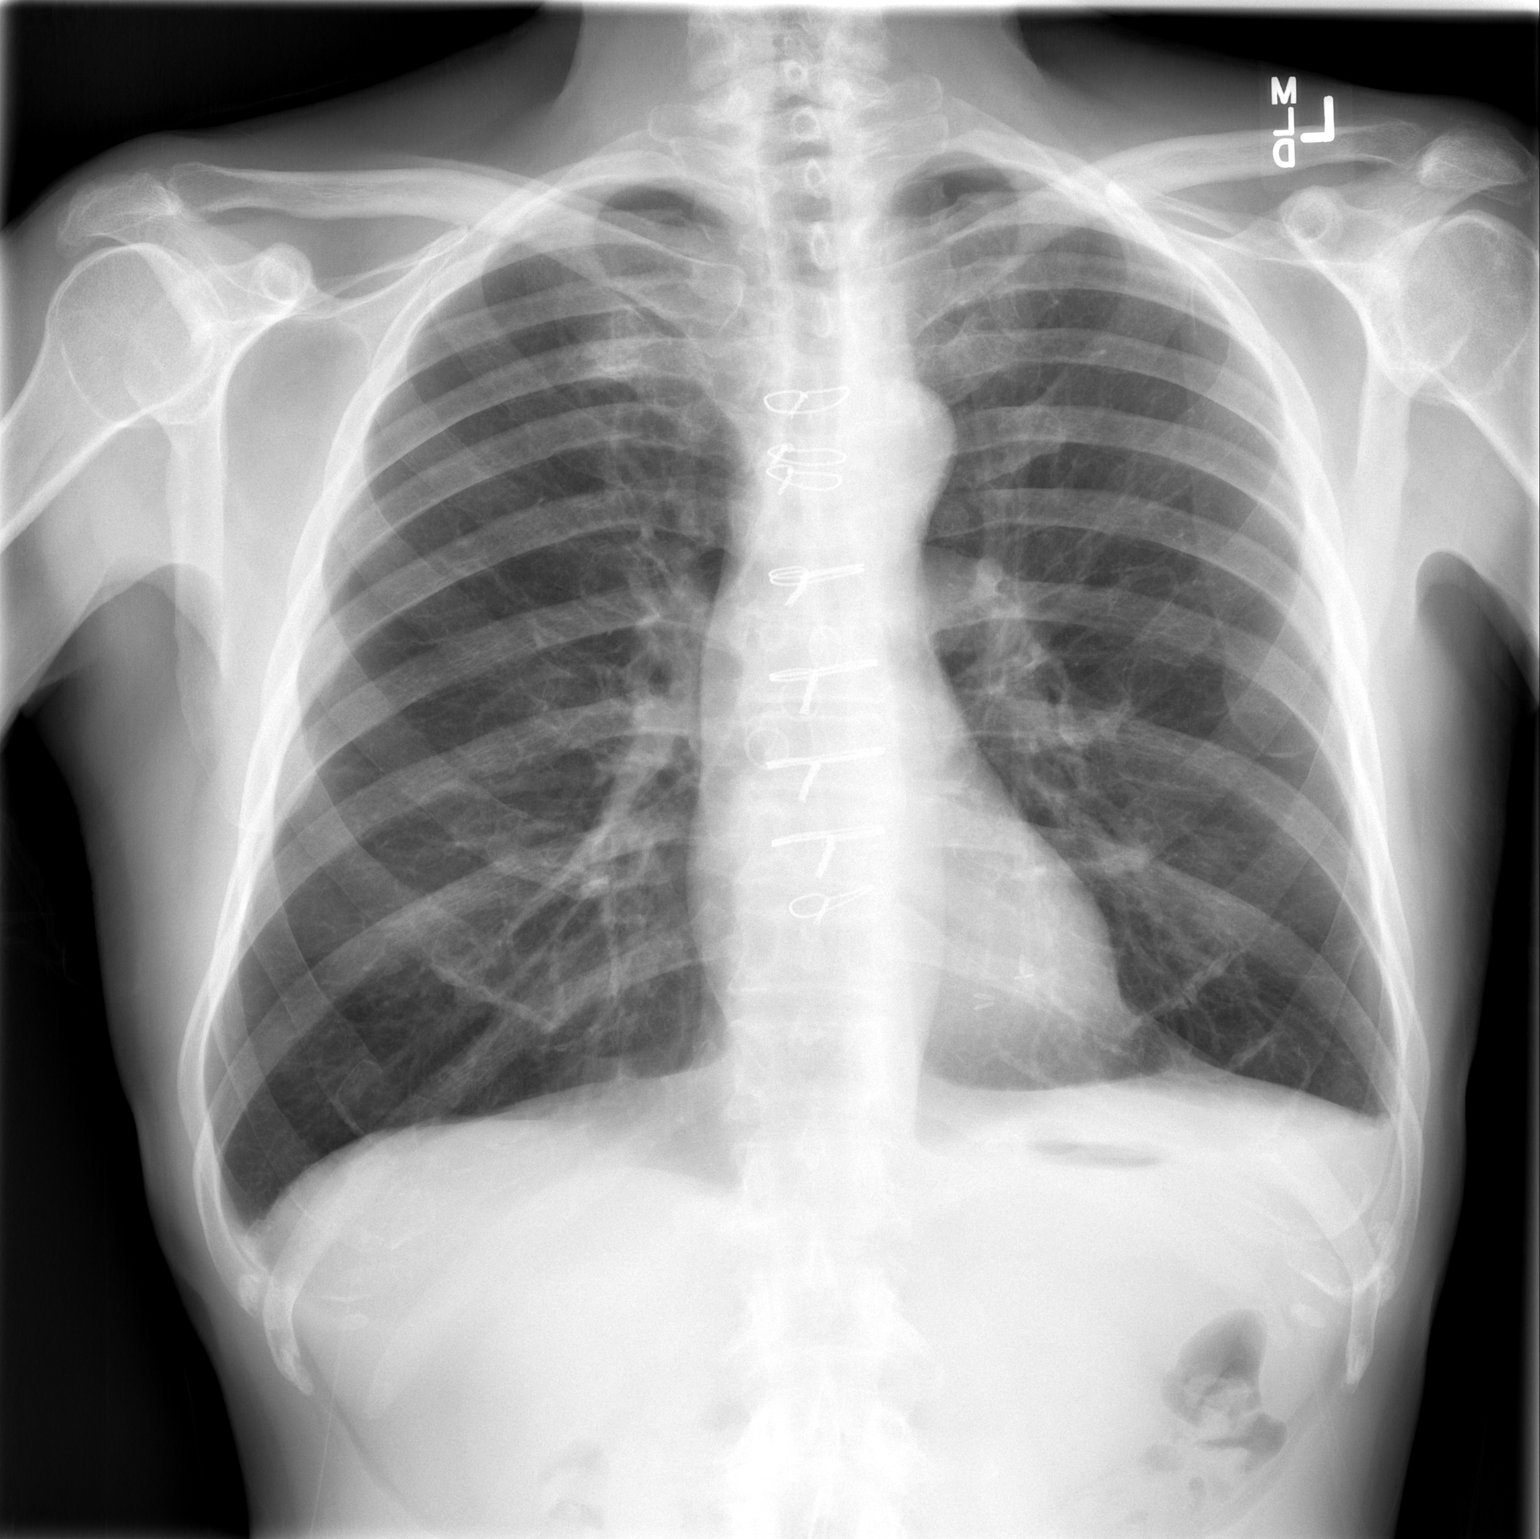

[w chest lat]
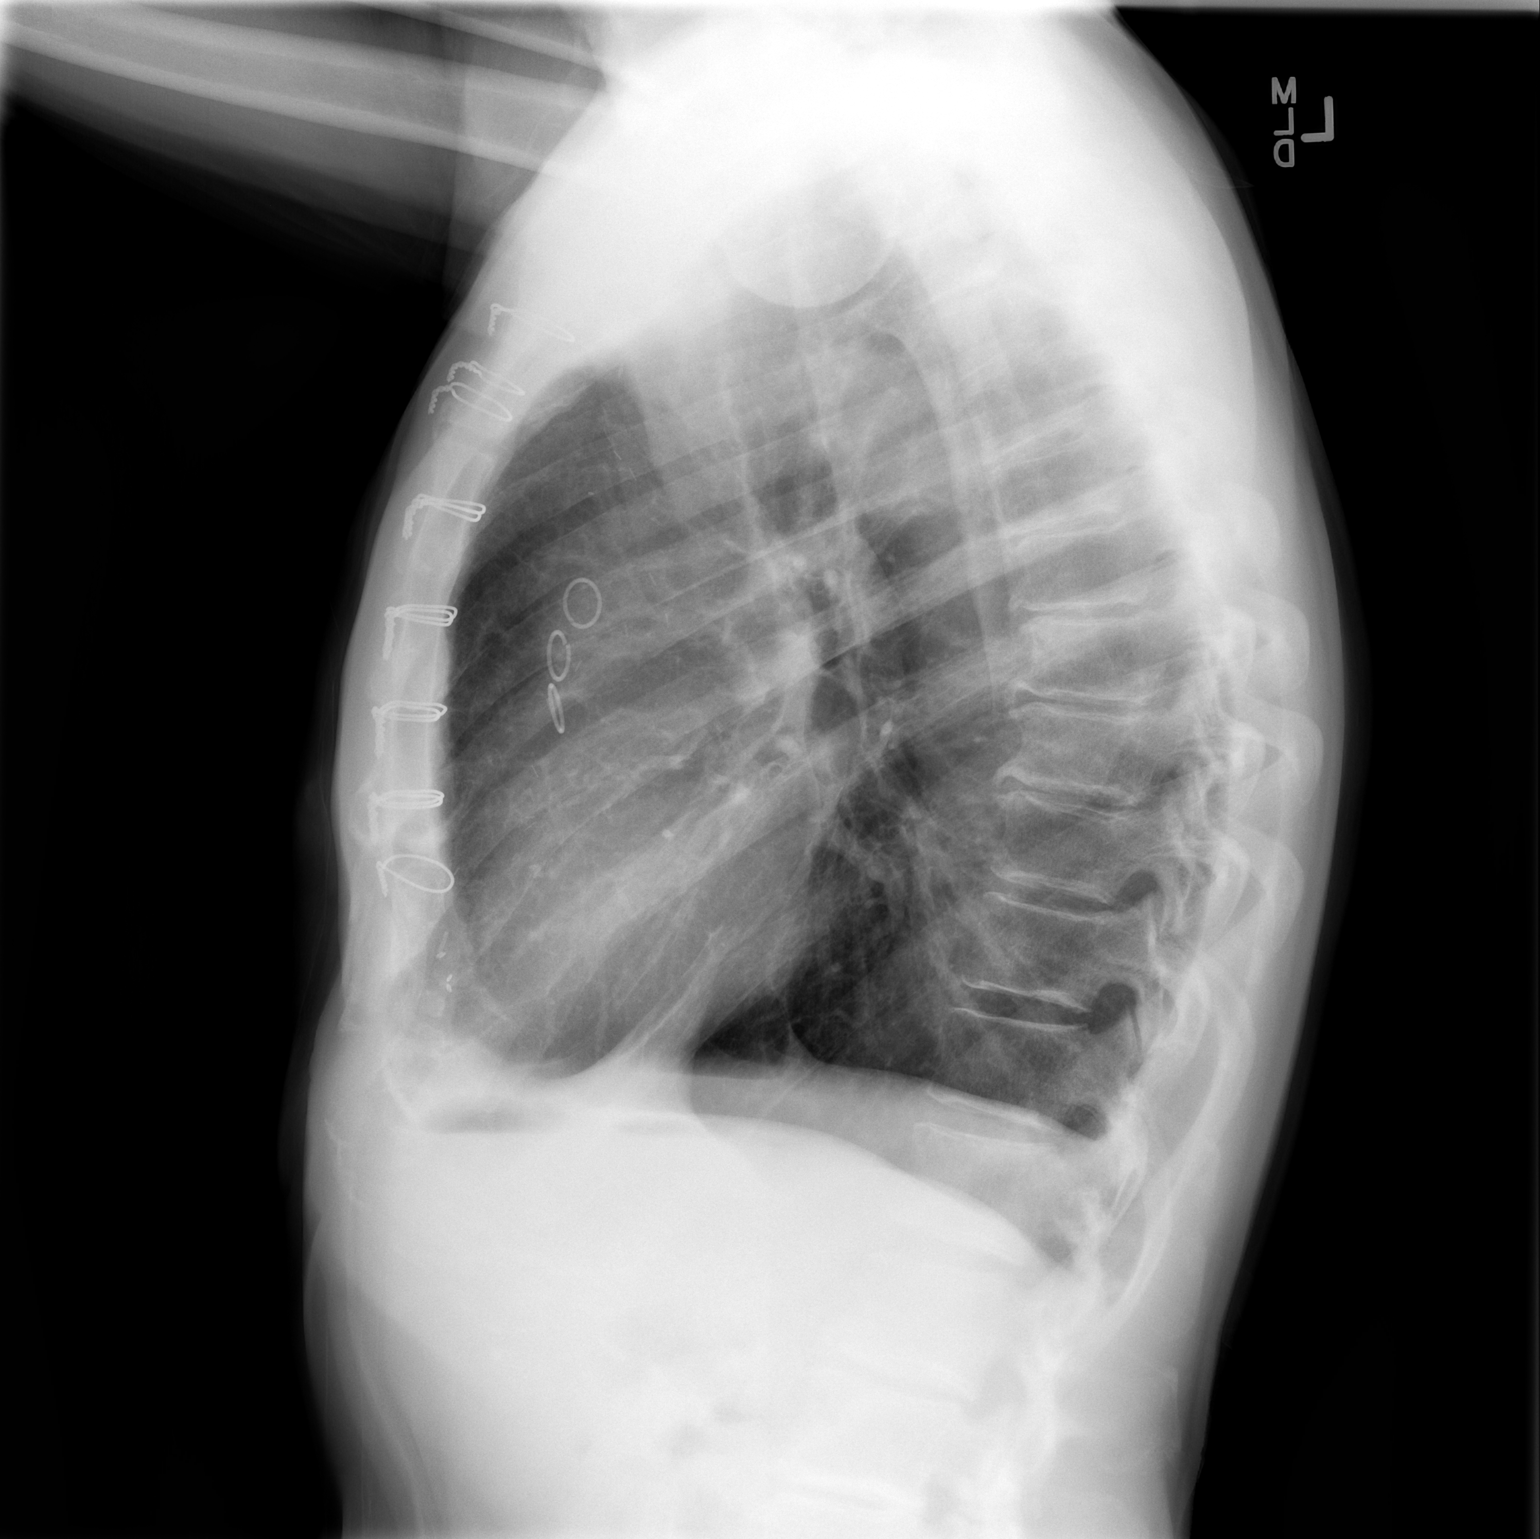

[2 of 2 positions shown; findings below may reference images not displayed]

FINDINGS: The lungs are mildly hyperinflated. A tiny amount of pleural fluid
blunts the costophrenic angles. There is no alveolar infiltrate or
pleural effusion. The heart and pulmonary vascularity are normal.
The mediastinum is normal in width. The sternal wires are intact.
The retrosternal soft tissues are normal. The observed bony thorax
exhibits no acute abnormality.
IMPRESSION: Mild hyperinflation consistent with COPD. Interval further decrease
in size of bilateral pleural effusions such that only a trace of
fluid is now present. No pulmonary edema.

## 2017-05-02 ENCOUNTER — Encounter (HOSPITAL_COMMUNITY): Payer: BLUE CROSS/BLUE SHIELD

## 2017-05-05 ENCOUNTER — Encounter (HOSPITAL_COMMUNITY): Payer: BLUE CROSS/BLUE SHIELD

## 2017-05-07 ENCOUNTER — Encounter (HOSPITAL_COMMUNITY): Payer: BLUE CROSS/BLUE SHIELD

## 2017-05-09 ENCOUNTER — Encounter (HOSPITAL_COMMUNITY): Payer: BLUE CROSS/BLUE SHIELD

## 2017-05-12 ENCOUNTER — Encounter (HOSPITAL_COMMUNITY): Payer: BLUE CROSS/BLUE SHIELD

## 2017-07-02 DIAGNOSIS — C61 Malignant neoplasm of prostate: Secondary | ICD-10-CM | POA: Diagnosis not present

## 2017-07-04 ENCOUNTER — Ambulatory Visit (INDEPENDENT_AMBULATORY_CARE_PROVIDER_SITE_OTHER): Payer: Medicare Other | Admitting: Internal Medicine

## 2017-07-04 ENCOUNTER — Encounter: Payer: Self-pay | Admitting: Internal Medicine

## 2017-07-04 VITALS — BP 118/64 | HR 64 | Temp 98.0°F | Resp 14 | Ht 66.0 in | Wt 119.1 lb

## 2017-07-04 DIAGNOSIS — Z1159 Encounter for screening for other viral diseases: Secondary | ICD-10-CM

## 2017-07-04 DIAGNOSIS — Z0000 Encounter for general adult medical examination without abnormal findings: Secondary | ICD-10-CM | POA: Diagnosis not present

## 2017-07-04 DIAGNOSIS — J439 Emphysema, unspecified: Secondary | ICD-10-CM

## 2017-07-04 DIAGNOSIS — Z23 Encounter for immunization: Secondary | ICD-10-CM

## 2017-07-04 DIAGNOSIS — Z09 Encounter for follow-up examination after completed treatment for conditions other than malignant neoplasm: Secondary | ICD-10-CM

## 2017-07-04 NOTE — Patient Instructions (Signed)
  GO TO THE FRONT DESK Schedule labs to be done next week, fasting  Schedule your next appointment for a  checkup in 6 months  Will schedule a long test called Pulmonary function tests at the  [facility] office

## 2017-07-04 NOTE — Progress Notes (Signed)
Subjective:    Patient ID: Noah Cochran, male    DOB: 19-Apr-1951, 66 y.o.   MRN: [id number]  DOS:  07/04/2017 Type of visit - description : cpx Interval history: Since the last visit, he had a CABG 5. Had rehabilitation, he is feeling well. Sees cardiology regularly   Review of Systems  After his MI and CABG 09-2016 he quit tobacco. Despite that he continue with chronic on and off cough, sometimes unable to sleep well at night, occasional feels wheezing. Sputum is white, no hemoptysis. No chest pain or DOE  Other than above, a 14 point review of systems is negative     Past Medical History:  Diagnosis Date  . 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 Coronary artery disease   . Elevated prostate specific antigen (PSA)   . History of chicken pox   . 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 Prostate cancer [facility])     Past Surgical History:  Procedure Laterality Date  . CARDIAC CATHETERIZATION N/A 10/18/2016   Procedure: Left Heart Cath and Coronary Angiography;  Surgeon: Charolette Forward, MD;  Location: [place] [facility];  Service: Cardiovascular;  Laterality: N/A;  . CORONARY ARTERY BYPASS GRAFT N/A 10/19/2016   Procedure: CORONARY ARTERY BYPASS GRAFTING (CABG) times five using left internal mammary artery and right saphenous vein.  Left mammary artery to Left anterior descending coronary artery, saphenous vein to diagonal, sequential vein to obtuse marginal and distal circumflex arteries, and vein graft to distal right coronary artery.;  Surgeon: [name], MD;  Location: [place];  Service: Open Hea  . ELBOW SURGERY     R elbow  . PROSTATE BIOPSY  01/31/2016  . SHOULDER SURGERY     reconstruction  . TEE WITHOUT CARDIOVERSION N/A 10/19/2016   Procedure: TRANSESOPHAGEAL ECHOCARDIOGRAM (TEE);  Surgeon: [name], MD;  Location: [place];  Service: Open Heart Surgery;  Laterality: N/A;  . TONSILLECTOMY AND ADENOIDECTOMY      Social History   Social History  .  Marital status: Divorced    Spouse name: N/A  . Number of children: 1  . Years of education: N/A   Occupational History  . carpenter- works part time [place] Topics  . Smoking status: Former Smoker    Packs/day: 1.00    Years: 40.00    Types: Cigarettes    Quit date: 10/12/2016  . Smokeless tobacco: Never Used     Comment: quit after STEMI  . Alcohol use 0.0 oz/week     Comment: beer after work, 0-2-3 /day  . Drug use: No  . Sexual activity: Yes   Other Topics Concern  . Not on file   Social History Narrative   Lives by himself     Family History  Problem Relation Age of Onset  . Heart disease Father        F had a MI age 32  . Diabetes Other        GF  . Colon cancer Neg Hx   . Prostate cancer Neg Hx   . Cancer Neg Hx      Allergies as of 07/04/2017      Reactions   Bee Venom Anaphylaxis      Medication List       Accurate as of 07/04/17 11:59 PM. Always use your most recent med list.          aspirin EC 81 MG  tablet Take 81 mg by mouth daily.   atorvastatin 80 MG tablet Commonly known as:  LIPITOR Take 0.5 tablets (40 mg total) by mouth at bedtime.   EPINEPHrine 0.3 mg/0.3 mL Soaj injection Commonly known as:  EPIPEN 2-PAK Inject 0.3 mLs (0.3 mg total) into the muscle once.   losartan 25 MG tablet Commonly known as:  COZAAR Take 25 mg by mouth daily.   metoprolol tartrate 25 MG tablet Commonly known as:  LOPRESSOR Take 1 tablet (25 mg total) by mouth 2 (two) times daily.   NITROGLYCERIN SL Place 1 tablet under the tongue daily as needed.          Objective:   Physical Exam BP 118/64 (BP Location: Right Arm, Patient Position: Sitting, Cuff Size: Small)   Pulse 64   Temp 98 F (36.7 C) (Oral)   Resp 14   Ht 5\' 6"  (1.676 m)   Wt 119 lb 2 oz (54 kg)   SpO2 90%   BMI 19.23 kg/m   General:   Well developed, well nourished . NAD.  Neck: No  thyromegaly  HEENT:  Normocephalic . Face symmetric,  atraumatic Lungs:  Decreased breath sounds but clear Normal respiratory effort, no intercostal retractions, no accessory muscle use. Heart: RRR,  no murmur.  No pretibial edema bilaterally  Abdomen:  Not distended, soft, non-tender. No rebound or rigidity.  No bruit Skin: Exposed areas without rash. Not pale. Not jaundice Neurologic:  alert & oriented X3.  Speech normal, gait appropriate for age and unassisted Strength symmetric and appropriate for age.  Psych: Cognition and judgment appear intact.  Cooperative with normal attention span and concentration.  Behavior appropriate. No anxious or depressed appearing.    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 CAD: CABG x5 01/21/2017, status post rehabilitation, currently with no symptoms and feeling well.  Last cardiology visit August 2018. Hyperlipidemia: Lipitor dose decreased to 40 mg by cardiologist few months ago COPD: Patient smoked 1 PPD x  40 years, quit 09-2016, continue with cough despite stopping tobacco. Occasional wheezing.CXR showed changes of COPD. Based on history he has emphysema. Will get PFTs, then likely will benefit from inhalers. RTC 6 months

## 2017-07-04 NOTE — Assessment & Plan Note (Addendum)
-  Td 2014; pnm23: today; flu shot today shingrex discussed  -Colon cancer screening: Never had a colonoscopy . Just had a prostate biopsy and is recovering from a CABG 5. Will discuss next year. -Tobacco: quit 09-2016   -Lung cancer screening: We will discuss at the next opportunity -Diet and exercise discussed, doing well after cardiac rehabilitation -Labs:  We will RTC fasting, CMP FLP CBC TSH hep C

## 2017-07-04 NOTE — Progress Notes (Signed)
Pre visit review using our clinic review tool, if applicable. No additional management support is needed unless otherwise documented below in the visit note. 

## 2017-07-05 NOTE — Assessment & Plan Note (Signed)
Prostate cancer: A couple of days ago had a surveillance prostate BX, reports pending. CAD: CABG x5 01/21/2017, status post rehabilitation, currently with no symptoms and feeling well.  Last cardiology visit August 2018. Hyperlipidemia: Lipitor dose decreased to 40 mg by cardiologist few months ago COPD: Patient smoked 1 PPD x  40 years, quit 09-2016, continue with cough despite stopping tobacco. Occasional wheezing.CXR showed changes of COPD. Based on history he has emphysema. Will get PFTs, then likely will benefit from inhalers. RTC 6 months

## 2017-07-07 ENCOUNTER — Other Ambulatory Visit: Payer: Self-pay

## 2017-07-07 ENCOUNTER — Other Ambulatory Visit (INDEPENDENT_AMBULATORY_CARE_PROVIDER_SITE_OTHER): Payer: Medicare Other

## 2017-07-07 DIAGNOSIS — Z1159 Encounter for screening for other viral diseases: Secondary | ICD-10-CM

## 2017-07-07 DIAGNOSIS — Z951 Presence of aortocoronary bypass graft: Secondary | ICD-10-CM

## 2017-07-07 DIAGNOSIS — Z0000 Encounter for general adult medical examination without abnormal findings: Secondary | ICD-10-CM

## 2017-07-07 LAB — CBC WITH DIFFERENTIAL/PLATELET
BASOS PCT: 1 % (ref 0.0–3.0)
Basophils Absolute: 0.1 10*3/uL (ref 0.0–0.1)
EOS ABS: 0.5 10*3/uL (ref 0.0–0.7)
EOS PCT: 9.8 % — AB (ref 0.0–5.0)
HEMATOCRIT: 42.8 % (ref 39.0–52.0)
HEMOGLOBIN: 14.5 g/dL (ref 13.0–17.0)
LYMPHS PCT: 25.7 % (ref 12.0–46.0)
Lymphs Abs: 1.3 10*3/uL (ref 0.7–4.0)
MCHC: 33.9 g/dL (ref 30.0–36.0)
MCV: 88.8 fl (ref 78.0–100.0)
MONO ABS: 1 10*3/uL (ref 0.1–1.0)
Monocytes Relative: 18.2 % — ABNORMAL HIGH (ref 3.0–12.0)
Neutro Abs: 2.4 10*3/uL (ref 1.4–7.7)
Neutrophils Relative %: 45.3 % (ref 43.0–77.0)
Platelets: 247 10*3/uL (ref 150.0–400.0)
RBC: 4.82 Mil/uL (ref 4.22–5.81)
RDW: 13.5 % (ref 11.5–15.5)
WBC: 5.2 10*3/uL (ref 4.0–10.5)

## 2017-07-07 LAB — COMPREHENSIVE METABOLIC PANEL
ALBUMIN: 3.8 g/dL (ref 3.5–5.2)
ALK PHOS: 94 U/L (ref 39–117)
ALT: 31 U/L (ref 0–53)
AST: 33 U/L (ref 0–37)
BILIRUBIN TOTAL: 0.4 mg/dL (ref 0.2–1.2)
BUN: 16 mg/dL (ref 6–23)
CO2: 29 mEq/L (ref 19–32)
CREATININE: 0.97 mg/dL (ref 0.40–1.50)
Calcium: 9.5 mg/dL (ref 8.4–10.5)
Chloride: 104 mEq/L (ref 96–112)
GFR: 82.57 mL/min (ref >60.00)
GLUCOSE: 102 mg/dL — AB (ref 70–99)
Potassium: 4.6 mEq/L (ref 3.5–5.1)
SODIUM: 142 meq/L (ref 135–145)
TOTAL PROTEIN: 7.6 g/dL (ref 6.0–8.3)

## 2017-07-07 LAB — LIPID PANEL
CHOLESTEROL: 160 mg/dL (ref 0–200)
HDL: 41.3 mg/dL (ref >39.00)
LDL Cholesterol: 98 mg/dL (ref 0–99)
NONHDL: 119.1
Total CHOL/HDL Ratio: 4
Triglycerides: 104 mg/dL (ref 0.0–149.0)
VLDL: 20.8 mg/dL (ref 0.0–40.0)

## 2017-07-07 LAB — TSH: TSH: 1 u[IU]/mL (ref 0.35–4.50)

## 2017-07-08 LAB — HEPATITIS C ANTIBODY
Hepatitis C Ab: NONREACTIVE
SIGNAL TO CUT-OFF: 0.26 (ref <1.00)

## 2017-07-10 MED ORDER — ATORVASTATIN CALCIUM 80 MG PO TABS: 80.0000 mg | ORAL_TABLET | Freq: Every day | ORAL | 3 refills | Status: AC

## 2017-07-11 ENCOUNTER — Telehealth: Payer: Self-pay | Admitting: Internal Medicine

## 2017-07-11 NOTE — Telephone Encounter (Signed)
My impression that he has a chronic cough, for now recommend Mucinex DM twice a day, proceed with PFTs, further cough treatment after results.

## 2017-07-11 NOTE — Telephone Encounter (Signed)
Please advise regarding cough.

## 2017-07-11 NOTE — Telephone Encounter (Signed)
Relation to pt: self  Call back number:[phone] Pharmacy: [facility], [place] [address] [place] [phone] (Phone) [phone] (Fax)     Reason for call:  Patient returning call and would like detail message regarding lab results and would like to know if PCP will prescribe cough Rx due to symptoms not improving. Patient states he's currently working therefore please leave detail message

## 2017-07-11 NOTE — Telephone Encounter (Signed)
Spoke w/ Pt, informed him of recommendations. Pt unaware of PFTs scheduled for 07/22/2017- informed of time, address. Pt verbalized understanding. Pt scheduled for 6 week labs on 08/25/2017 at 0915.

## 2017-07-22 ENCOUNTER — Ambulatory Visit (INDEPENDENT_AMBULATORY_CARE_PROVIDER_SITE_OTHER): Payer: Medicare Other | Admitting: Internal Medicine

## 2017-07-22 DIAGNOSIS — J439 Emphysema, unspecified: Secondary | ICD-10-CM | POA: Diagnosis not present

## 2017-07-22 LAB — PULMONARY FUNCTION TEST
DL/VA % pred: 69 %
DL/VA: 3.02 ml/min/mmHg/L
DLCO COR: 18.17 ml/min/mmHg
DLCO UNC % PRED: 64 %
DLCO UNC: 17.29 ml/min/mmHg
DLCO cor % pred: 67 %
FEF 25-75 PRE: 0.6 L/s
FEF 25-75 Post: 1.11 L/sec
FEF2575-%Change-Post: 84 %
FEF2575-%PRED-POST: 46 %
FEF2575-%PRED-PRE: 25 %
FEV1-%Change-Post: 18 %
FEV1-%PRED-POST: 59 %
FEV1-%PRED-PRE: 50 %
FEV1-PRE: 1.48 L
FEV1-Post: 1.76 L
FEV1FVC-%Change-Post: -6 %
FEV1FVC-%Pred-Pre: 68 %
FEV6-%CHANGE-POST: 24 %
FEV6-%PRED-POST: 93 %
FEV6-%Pred-Pre: 75 %
FEV6-PRE: 2.8 L
FEV6-Post: 3.48 L
FEV6FVC-%CHANGE-POST: -1 %
FEV6FVC-%Pred-Post: 101 %
FEV6FVC-%Pred-Pre: 103 %
FVC-%Change-Post: 26 %
FVC-%Pred-Post: 91 %
FVC-%Pred-Pre: 72 %
FVC-Post: 3.63 L
FVC-Pre: 2.87 L
POST FEV1/FVC RATIO: 48 %
PRE FEV1/FVC RATIO: 52 %
Post FEV6/FVC ratio: 96 %
Pre FEV6/FVC Ratio: 98 %
RV % PRED: 220 %
RV: 4.7 L
TLC % pred: 129 %
TLC: 8.03 L

## 2017-07-22 NOTE — Progress Notes (Signed)
PFT done today by Chanese Hartsough, CMA  

## 2017-07-24 MED ORDER — BUDESONIDE-FORMOTEROL FUMARATE 160-4.5 MCG/ACT IN AERO: 2.0000 | INHALATION_SPRAY | Freq: Two times a day (BID) | RESPIRATORY_TRACT | 5 refills | Status: DC

## 2017-07-24 NOTE — Addendum Note (Signed)
Addended byDamita Dunnings D on: 07/24/2017 04:04 PM   Modules accepted: Orders

## 2017-08-18 ENCOUNTER — Telehealth: Payer: Self-pay

## 2017-08-18 DIAGNOSIS — J449 Chronic obstructive pulmonary disease, unspecified: Secondary | ICD-10-CM | POA: Diagnosis not present

## 2017-08-18 DIAGNOSIS — E785 Hyperlipidemia, unspecified: Secondary | ICD-10-CM | POA: Diagnosis not present

## 2017-08-18 DIAGNOSIS — I10 Essential (primary) hypertension: Secondary | ICD-10-CM | POA: Diagnosis not present

## 2017-08-18 DIAGNOSIS — I252 Old myocardial infarction: Secondary | ICD-10-CM | POA: Diagnosis not present

## 2017-08-18 DIAGNOSIS — I2510 Atherosclerotic heart disease of native coronary artery without angina pectoris: Secondary | ICD-10-CM | POA: Diagnosis not present

## 2017-08-18 NOTE — Telephone Encounter (Signed)
Completed.

## 2017-08-25 ENCOUNTER — Other Ambulatory Visit (INDEPENDENT_AMBULATORY_CARE_PROVIDER_SITE_OTHER): Payer: Medicare Other

## 2017-08-25 DIAGNOSIS — Z951 Presence of aortocoronary bypass graft: Secondary | ICD-10-CM | POA: Diagnosis not present

## 2017-08-25 LAB — LIPID PANEL
CHOL/HDL RATIO: 3
Cholesterol: 164 mg/dL (ref 0–200)
HDL: 64.8 mg/dL (ref >39.00)
LDL CALC: 76 mg/dL (ref 0–99)
NONHDL: 99.47
Triglycerides: 118 mg/dL (ref 0.0–149.0)
VLDL: 23.6 mg/dL (ref 0.0–40.0)

## 2017-08-25 LAB — ALT: ALT: 20 U/L (ref 0–53)

## 2017-08-25 LAB — AST: AST: 22 U/L (ref 0–37)

## 2017-10-14 ENCOUNTER — Telehealth: Payer: Self-pay | Admitting: Internal Medicine

## 2017-10-14 NOTE — Telephone Encounter (Signed)
Called and left pt a message their appt on 01/02/18 needs to be rescheduled per pcp.

## 2017-11-17 DIAGNOSIS — I2510 Atherosclerotic heart disease of native coronary artery without angina pectoris: Secondary | ICD-10-CM | POA: Diagnosis not present

## 2017-11-17 DIAGNOSIS — I252 Old myocardial infarction: Secondary | ICD-10-CM | POA: Diagnosis not present

## 2017-11-17 DIAGNOSIS — J449 Chronic obstructive pulmonary disease, unspecified: Secondary | ICD-10-CM | POA: Diagnosis not present

## 2017-11-17 DIAGNOSIS — E785 Hyperlipidemia, unspecified: Secondary | ICD-10-CM | POA: Diagnosis not present

## 2017-11-17 DIAGNOSIS — I10 Essential (primary) hypertension: Secondary | ICD-10-CM | POA: Diagnosis not present

## 2018-01-02 ENCOUNTER — Ambulatory Visit: Payer: Self-pay | Admitting: Internal Medicine

## 2018-01-06 ENCOUNTER — Ambulatory Visit (INDEPENDENT_AMBULATORY_CARE_PROVIDER_SITE_OTHER): Payer: Medicare Other | Admitting: Internal Medicine

## 2018-01-06 ENCOUNTER — Encounter: Payer: Self-pay | Admitting: Internal Medicine

## 2018-01-06 VITALS — BP 131/76 | HR 62 | Temp 98.0°F | Resp 16 | Ht 64.0 in | Wt 128.4 lb

## 2018-01-06 DIAGNOSIS — I2510 Atherosclerotic heart disease of native coronary artery without angina pectoris: Secondary | ICD-10-CM | POA: Diagnosis not present

## 2018-01-06 DIAGNOSIS — J449 Chronic obstructive pulmonary disease, unspecified: Secondary | ICD-10-CM | POA: Diagnosis not present

## 2018-01-06 DIAGNOSIS — C61 Malignant neoplasm of prostate: Secondary | ICD-10-CM

## 2018-01-06 DIAGNOSIS — I519 Heart disease, unspecified: Secondary | ICD-10-CM

## 2018-01-06 LAB — BASIC METABOLIC PANEL
BUN: 21 mg/dL (ref 6–23)
CHLORIDE: 101 meq/L (ref 96–112)
CO2: 29 meq/L (ref 19–32)
Calcium: 9.4 mg/dL (ref 8.4–10.5)
Creatinine, Ser: 1.04 mg/dL (ref 0.40–1.50)
GFR: 76.07 mL/min (ref >60.00)
Glucose, Bld: 78 mg/dL (ref 70–99)
POTASSIUM: 4.4 meq/L (ref 3.5–5.1)
Sodium: 134 mEq/L — ABNORMAL LOW (ref 135–145)

## 2018-01-06 NOTE — Progress Notes (Signed)
Subjective:    Patient ID: Noah Cochran, male    DOB: [birth year], 67 y.o.   MRN: [id number]  DOS:  01/06/2018 Type of visit - description : f/u Interval history: Since the last visit he is doing well. Reports leg cramps, bilateral, at night, about one episode a week Likes to discuss immunizations specifically shingrix   Review of Systems No chest pain no difficulty breathing Cough has decreased significantly since he is taking Symbicort.   Past Medical History:  Diagnosis Date  . 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 Coronary artery disease   . Elevated prostate specific antigen (PSA)   . History of chicken pox   . 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 Prostate cancer [facility])     Past Surgical History:  Procedure Laterality Date  . CARDIAC CATHETERIZATION N/A 10/18/2016   Procedure: Left Heart Cath and Coronary Angiography;  Surgeon: Charolette Forward, MD;  Location: [place] CV LAB;  Service: Cardiovascular;  Laterality: N/A;  . CORONARY ARTERY BYPASS GRAFT N/A 10/19/2016   Procedure: CORONARY ARTERY BYPASS GRAFTING (CABG) times five using left internal mammary artery and right saphenous vein.  Left mammary artery to Left anterior descending coronary artery, saphenous vein to diagonal, sequential vein to obtuse marginal and distal circumflex arteries, and vein graft to distal right coronary artery.;  Surgeon: [name], MD;  Location: [place];  Service: Open Hea  . ELBOW SURGERY     R elbow  . PROSTATE BIOPSY  01/31/2016  . SHOULDER SURGERY     reconstruction  . TEE WITHOUT CARDIOVERSION N/A 10/19/2016   Procedure: TRANSESOPHAGEAL ECHOCARDIOGRAM (TEE);  Surgeon: [name], MD;  Location: [place];  Service: Open Heart Surgery;  Laterality: N/A;  . TONSILLECTOMY AND ADENOIDECTOMY      Social History   Socioeconomic History  . Marital status: Divorced    Spouse name: Not on file  . Number of children: 1  . Years of education: Not on file  . Highest  education level: Not on file  Occupational History  . 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 Financial resource strain: Not on file  . Food insecurity:    Worry: Not on file    Inability: Not on file  . Transportation needs:    Medical: Not on file    Non-medical: Not on file  Tobacco Use  . Smoking status: Former Smoker    Packs/day: 1.00    Years: 40.00    Pack years: 40.00    Types: Cigarettes    Last attempt to quit: 10/12/2016    Years since quitting: 1.2  . Smokeless tobacco: Never Used  . Tobacco comment: quit after STEMI  Substance and Sexual Activity  . Alcohol use: Yes    Alcohol/week: 0.0 oz    Comment: beer after work, 0-2-3 /day  . Drug use: No  . Sexual activity: Yes  Lifestyle  . Physical activity:    Days per week: Not on file    Minutes per session: Not on file  . Stress: Not on file  Relationships  . Social connections:    Talks on phone: Not on file    Gets together: Not on file    Attends religious service: Not on file    Active member of club or organization: Not on file    Attends meetings of clubs or organizations: Not on file  Relationship status: Not on file  . Intimate partner violence:    Fear of current or ex partner: Not on file    Emotionally abused: Not on file    Physically abused: Not on file    Forced sexual activity: Not on file  Other Topics Concern  . Not on file  Social History Narrative   Lives by himself      Allergies as of 01/06/2018      Reactions   Bee Venom Anaphylaxis      Medication List        Accurate as of 01/06/18  5:56 PM. Always use your most recent med list.          aspirin EC 81 MG tablet Take 81 mg by mouth daily.   atorvastatin 80 MG tablet Commonly known as:  LIPITOR Take 1 tablet (80 mg total) by mouth at bedtime.   budesonide-formoterol 160-4.5 MCG/ACT inhaler Commonly known as:  SYMBICORT Inhale 2 puffs into the lungs 2 (two) times  daily.   EPINEPHrine 0.3 mg/0.3 mL Soaj injection Commonly known as:  EPIPEN 2-PAK Inject 0.3 mLs (0.3 mg total) into the muscle once.   losartan 25 MG tablet Commonly known as:  COZAAR Take 25 mg by mouth daily.   metoprolol tartrate 25 MG tablet Commonly known as:  LOPRESSOR Take 1 tablet (25 mg total) by mouth 2 (two) times daily.   NITROGLYCERIN SL Place 1 tablet under the tongue daily as needed.          Objective:   Physical Exam BP 131/76 (BP Location: Right Arm, Patient Position: Sitting, Cuff Size: Small)   Pulse 62   Temp 98 F (36.7 C) (Oral)   Resp 16   Ht 5\' 4"  (1.626 m)   Wt 128 lb 6.4 oz (58.2 kg)   BMI 22.04 kg/m  General:   Well developed, well nourished . NAD.  HEENT:  Normocephalic . Face symmetric, atraumatic Lungs:  CTA B Normal respiratory effort, no intercostal retractions, no accessory muscle use. Heart: RRR,  no murmur.  No pretibial edema bilaterally  Skin: Not pale. Not jaundice Neurologic:  alert & oriented X3.  Speech normal, gait appropriate for age and unassisted Psych--  Cognition and judgment appear intact.  Cooperative with normal attention span and concentration.  Behavior appropriate. No anxious or depressed appearing.      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  Check a BMP COPD: DX confirmed by PFTs, currently on Symbicort 2 puffs twice daily, no further coughing.  Would like to take less medication, okay to decrease to 1 puff B.I.D..  In the future consider switch to Spiriva. Leg cramps: Happening about once a week, recommend tonic water prn Preventive care: Due for lung cancer screening and PNM 13 on RTC.  Okay to proceed with Shingrix when  available RTC 06-2018 CPX

## 2018-01-06 NOTE — Assessment & Plan Note (Signed)
Prostate cancer: Currently on surveillance, doing well CAD: Asx, continue aspirin, Lipitor, losartan, Lopressor.  Check a BMP COPD: DX confirmed by PFTs, currently on Symbicort 2 puffs twice daily, no further coughing.  Would like to take less medication, okay to decrease to 1 puff B.I.D..  In the future consider switch to Spiriva. Leg cramps: Happening about once a week, recommend tonic water prn Preventive care: Due for lung cancer screening and PNM 13 on RTC.  Okay to proceed with Shingrix when available RTC 06-2018 CPX

## 2018-01-06 NOTE — Patient Instructions (Signed)
GO TO THE LAB : Get the blood work     GO TO THE FRONT DESK Schedule your next appointment for a   physical exam in 6 months  Decrease Symbicort to 1 puff twice a day

## 2018-02-09 ENCOUNTER — Telehealth: Payer: Self-pay | Admitting: Internal Medicine

## 2018-02-09 DIAGNOSIS — Z720 Tobacco use: Secondary | ICD-10-CM | POA: Diagnosis not present

## 2018-02-09 DIAGNOSIS — I10 Essential (primary) hypertension: Secondary | ICD-10-CM | POA: Diagnosis not present

## 2018-02-09 DIAGNOSIS — I2510 Atherosclerotic heart disease of native coronary artery without angina pectoris: Secondary | ICD-10-CM | POA: Diagnosis not present

## 2018-02-09 NOTE — Telephone Encounter (Signed)
Copied from [place] [id number]. Topic: Quick Communication - See Telephone Encounter >> Feb 09, 2018  9:48 AM Ahmed Prima L wrote: CRM for notification. See Telephone encounter for: 02/09/18.  Patient said that he just left Dr Pricilla Riffle office and he is requesting that Dr [name] send his results from his 4/16 six month follow up blood work to his office. It can be faxed to [phone]

## 2018-02-09 NOTE — Telephone Encounter (Signed)
Labs faxed

## 2018-06-01 ENCOUNTER — Other Ambulatory Visit: Payer: Self-pay | Admitting: Internal Medicine

## 2018-06-08 DIAGNOSIS — I10 Essential (primary) hypertension: Secondary | ICD-10-CM | POA: Diagnosis not present

## 2018-06-08 DIAGNOSIS — I252 Old myocardial infarction: Secondary | ICD-10-CM | POA: Diagnosis not present

## 2018-06-08 DIAGNOSIS — E785 Hyperlipidemia, unspecified: Secondary | ICD-10-CM | POA: Diagnosis not present

## 2018-06-08 DIAGNOSIS — I2510 Atherosclerotic heart disease of native coronary artery without angina pectoris: Secondary | ICD-10-CM | POA: Diagnosis not present

## 2018-07-08 ENCOUNTER — Ambulatory Visit (INDEPENDENT_AMBULATORY_CARE_PROVIDER_SITE_OTHER): Payer: Medicare Other | Admitting: Internal Medicine

## 2018-07-08 ENCOUNTER — Encounter: Payer: Self-pay | Admitting: Internal Medicine

## 2018-07-08 VITALS — BP 132/80 | HR 58 | Temp 98.0°F | Resp 16 | Ht 64.0 in | Wt 122.0 lb

## 2018-07-08 DIAGNOSIS — Z122 Encounter for screening for malignant neoplasm of respiratory organs: Secondary | ICD-10-CM | POA: Diagnosis not present

## 2018-07-08 DIAGNOSIS — Z23 Encounter for immunization: Secondary | ICD-10-CM

## 2018-07-08 DIAGNOSIS — Z87891 Personal history of nicotine dependence: Secondary | ICD-10-CM | POA: Diagnosis not present

## 2018-07-08 DIAGNOSIS — Z0000 Encounter for general adult medical examination without abnormal findings: Secondary | ICD-10-CM

## 2018-07-08 NOTE — Assessment & Plan Note (Addendum)
-  Td 2014; pnm23: today; prevnar 06-2018; shingrix #1 last week per pt; flu shot last week per [facility], pt denies , will contact the pharmacy (addendum, pharmacy confirmed he got both Shingrix and a flu shot) -Colon cancer screening: Never had a colonoscopy; pro-cons of 3 options discussed , will think about it.  Addendum: Decided to take an IFOB - prostate ca : d/t see Dr Jeffie Pollock, declined referral, will call -Lung cancer screening:  Smoked 1 ppd x 40 years quit 2018, agreed to proceed  -Diet and exercise discussed, doing well after cardiac rehabilitation -Labs:  We will RTC fasting, CMP FLP CBC

## 2018-07-08 NOTE — Patient Instructions (Signed)
  GO TO THE FRONT DESK Schedule labs to be done fasting, this week.  Schedule your next appointment for a  Check up in 6 months   Colon cancer screening options  Colonoscopy Genetic testing in the stools COLOGUAR Simple stool test

## 2018-07-08 NOTE — Progress Notes (Signed)
Pre visit review using our clinic review tool, if applicable. No additional management support is needed unless otherwise documented below in the visit note. 

## 2018-07-08 NOTE — Progress Notes (Signed)
Subjective:    Patient ID: Noah Cochran, male    DOB: 07/31/1951, 67 y.o.   MRN: [id number]  DOS:  07/08/2018 Type of visit - description : cpx Interval history: Feels well    Review of Systems Other than undergoing some dental procedures he is essentially asymptomatic.  Other than above, a 14 point review of systems is negative    Past Medical History:  Diagnosis Date  . 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 Coronary artery disease   . Elevated prostate specific antigen (PSA)   . History of chicken pox   . 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 Prostate cancer [facility])     Past Surgical History:  Procedure Laterality Date  . CARDIAC CATHETERIZATION N/A 10/18/2016   Procedure: Left Heart Cath and Coronary Angiography;  Surgeon: Charolette Forward, MD;  Location: [place] CV LAB;  Service: Cardiovascular;  Laterality: N/A;  . CORONARY ARTERY BYPASS GRAFT N/A 10/19/2016   Procedure: CORONARY ARTERY BYPASS GRAFTING (CABG) times five using left internal mammary artery and right saphenous vein.  Left mammary artery to Left anterior descending coronary artery, saphenous vein to diagonal, sequential vein to obtuse marginal and distal circumflex arteries, and vein graft to distal right coronary artery.;  Surgeon: [name], MD;  Location: [place];  Service: Open Hea  . ELBOW SURGERY     R elbow  . PROSTATE BIOPSY  01/31/2016  . SHOULDER SURGERY     reconstruction  . TEE WITHOUT CARDIOVERSION N/A 10/19/2016   Procedure: TRANSESOPHAGEAL ECHOCARDIOGRAM (TEE);  Surgeon: [name], MD;  Location: [place];  Service: Open Heart Surgery;  Laterality: N/A;  . TONSILLECTOMY AND ADENOIDECTOMY      Social History   Socioeconomic History  . Marital status: Divorced    Spouse name: Not on file  . Number of children: 1  . Years of education: Not on file  . Highest education level: Not on file  Occupational History  . 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 Financial resource strain: Not on file  . Food insecurity:    Worry: Not on file    Inability: Not on file  . Transportation needs:    Medical: Not on file    Non-medical: Not on file  Tobacco Use  . Smoking status: Former Smoker    Packs/day: 1.00    Years: 40.00    Pack years: 40.00    Types: Cigarettes    Last attempt to quit: 10/12/2016    Years since quitting: 1.7  . Smokeless tobacco: Never Used  . Tobacco comment: quit after STEMI (1ppdx 40 years)  Substance and Sexual Activity  . Alcohol use: Yes    Alcohol/week: 0.0 standard drinks    Comment: beer after work, 0-2-3 /day  . Drug use: No  . Sexual activity: Yes  Lifestyle  . Physical activity:    Days per week: Not on file    Minutes per session: Not on file  . Stress: Not on file  Relationships  . Social connections:    Talks on phone: Not on file    Gets together: Not on file    Attends religious service: Not on file    Active member of club or organization: Not on file    Attends meetings of clubs or organizations: Not on file    Relationship status: Not on file  . Intimate partner violence:  Fear of current or ex partner: Not on file    Emotionally abused: Not on file    Physically abused: Not on file    Forced sexual activity: Not on file  Other Topics Concern  . Not on file  Social History Narrative   Lives by himself     Family History  Problem Relation Age of Onset  . Heart disease Father        F had a MI age 3  . Diabetes Other        GF  . Colon cancer Neg Hx   . Prostate cancer Neg Hx   . Cancer Neg Hx      Allergies as of 07/08/2018      Reactions   Bee Venom Anaphylaxis      Medication List        Accurate as of 07/08/18 11:59 PM. Always use your most recent med list.          aspirin EC 81 MG tablet Take 81 mg by mouth daily.   atorvastatin 80 MG tablet Commonly known as:  LIPITOR Take 1 tablet (80 mg total) by mouth at  bedtime.   budesonide-formoterol 160-4.5 MCG/ACT inhaler Commonly known as:  SYMBICORT Inhale 2 puffs into the lungs 2 (two) times daily.   EPINEPHrine 0.3 mg/0.3 mL Soaj injection Commonly known as:  EPI-PEN Inject 0.3 mLs (0.3 mg total) into the muscle once.   losartan 25 MG tablet Commonly known as:  COZAAR Take 25 mg by mouth daily.   metoprolol tartrate 25 MG tablet Commonly known as:  LOPRESSOR Take 1 tablet (25 mg total) by mouth 2 (two) times daily.   NITROGLYCERIN SL Place 1 tablet under the tongue daily as needed.          Objective:   Physical Exam BP 132/80 (BP Location: Left Arm, Patient Position: Sitting, Cuff Size: Small)   Pulse (!) 58   Temp 98 F (36.7 C) (Oral)   Resp 16   Ht 5\' 4"  (1.626 m)   Wt 122 lb (55.3 kg)   BMI 20.94 kg/m  General: Well developed, NAD, see BMI.  Neck: No  thyromegaly  HEENT:  Normocephalic . Face symmetric, atraumatic Lungs:  CTA B Normal respiratory effort, no intercostal retractions, no accessory muscle use. Heart: RRR,  no murmur.  No pretibial edema bilaterally  Abdomen:  Not distended, soft, non-tender. No rebound or rigidity.   Skin: Exposed areas without rash. Not pale. Not jaundice Neurologic:  alert & oriented X3.  Speech normal, gait appropriate for age and unassisted Strength symmetric and appropriate for age.  Psych: Cognition and judgment appear intact.  Cooperative with normal attention span and concentration.  Behavior appropriate. No anxious or depressed appearing.     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 COPD: On Symbicort uses 1or 2 puffs twice a day, he is actually satisfied with the treatment and desires to continue the same.  If he continued to do well, okay to use inhalers as  needed only RTC 6 months

## 2018-07-09 ENCOUNTER — Ambulatory Visit (HOSPITAL_BASED_OUTPATIENT_CLINIC_OR_DEPARTMENT_OTHER)
Admission: RE | Admit: 2018-07-09 | Discharge: 2018-07-09 | Disposition: A | Discharge status: Home / Self Care | Payer: Medicare Other | Source: Ambulatory Visit | Attending: Internal Medicine | Admitting: Internal Medicine

## 2018-07-09 ENCOUNTER — Other Ambulatory Visit (INDEPENDENT_AMBULATORY_CARE_PROVIDER_SITE_OTHER): Payer: Medicare Other

## 2018-07-09 DIAGNOSIS — Z122 Encounter for screening for malignant neoplasm of respiratory organs: Secondary | ICD-10-CM | POA: Diagnosis not present

## 2018-07-09 DIAGNOSIS — J432 Centrilobular emphysema: Secondary | ICD-10-CM | POA: Insufficient documentation

## 2018-07-09 DIAGNOSIS — I2510 Atherosclerotic heart disease of native coronary artery without angina pectoris: Secondary | ICD-10-CM | POA: Insufficient documentation

## 2018-07-09 DIAGNOSIS — Z0000 Encounter for general adult medical examination without abnormal findings: Secondary | ICD-10-CM | POA: Diagnosis not present

## 2018-07-09 DIAGNOSIS — R918 Other nonspecific abnormal finding of lung field: Secondary | ICD-10-CM | POA: Insufficient documentation

## 2018-07-09 DIAGNOSIS — Z87891 Personal history of nicotine dependence: Secondary | ICD-10-CM | POA: Diagnosis not present

## 2018-07-09 DIAGNOSIS — Z951 Presence of aortocoronary bypass graft: Secondary | ICD-10-CM | POA: Insufficient documentation

## 2018-07-09 DIAGNOSIS — I700 Atherosclerosis of aorta: Secondary | ICD-10-CM | POA: Insufficient documentation

## 2018-07-09 DIAGNOSIS — J438 Other emphysema: Secondary | ICD-10-CM | POA: Insufficient documentation

## 2018-07-09 LAB — COMPREHENSIVE METABOLIC PANEL
ALK PHOS: 83 U/L (ref 39–117)
ALT: 24 U/L (ref 0–53)
AST: 31 U/L (ref 0–37)
Albumin: 4.4 g/dL (ref 3.5–5.2)
BUN: 17 mg/dL (ref 6–23)
CALCIUM: 9.7 mg/dL (ref 8.4–10.5)
CHLORIDE: 102 meq/L (ref 96–112)
CO2: 26 mEq/L (ref 19–32)
Creatinine, Ser: 1.13 mg/dL (ref 0.40–1.50)
GFR: 69.01 mL/min (ref >60.00)
Glucose, Bld: 87 mg/dL (ref 70–99)
POTASSIUM: 4.8 meq/L (ref 3.5–5.1)
Sodium: 138 mEq/L (ref 135–145)
Total Bilirubin: 0.6 mg/dL (ref 0.2–1.2)
Total Protein: 7.5 g/dL (ref 6.0–8.3)

## 2018-07-09 LAB — CBC WITH DIFFERENTIAL/PLATELET
Basophils Absolute: 0.1 10*3/uL (ref 0.0–0.1)
Basophils Relative: 0.9 % (ref 0.0–3.0)
EOS ABS: 0.2 10*3/uL (ref 0.0–0.7)
Eosinophils Relative: 3.4 % (ref 0.0–5.0)
HEMATOCRIT: 46.3 % (ref 39.0–52.0)
Hemoglobin: 15.5 g/dL (ref 13.0–17.0)
LYMPHS PCT: 26.1 % (ref 12.0–46.0)
Lymphs Abs: 1.8 10*3/uL (ref 0.7–4.0)
MCHC: 33.6 g/dL (ref 30.0–36.0)
MCV: 95.8 fl (ref 78.0–100.0)
Monocytes Absolute: 1.2 10*3/uL — ABNORMAL HIGH (ref 0.1–1.0)
Monocytes Relative: 16.7 % — ABNORMAL HIGH (ref 3.0–12.0)
NEUTROS ABS: 3.7 10*3/uL (ref 1.4–7.7)
NEUTROS PCT: 52.9 % (ref 43.0–77.0)
PLATELETS: 207 10*3/uL (ref 150.0–400.0)
RBC: 4.83 Mil/uL (ref 4.22–5.81)
RDW: 13.4 % (ref 11.5–15.5)
WBC: 7 10*3/uL (ref 4.0–10.5)

## 2018-07-09 LAB — LIPID PANEL
CHOL/HDL RATIO: 2
Cholesterol: 166 mg/dL (ref 0–200)
HDL: 71.8 mg/dL (ref >39.00)
LDL CALC: 69 mg/dL (ref 0–99)
NONHDL: 93.77
TRIGLYCERIDES: 124 mg/dL (ref 0.0–149.0)
VLDL: 24.8 mg/dL (ref 0.0–40.0)

## 2018-07-09 NOTE — Assessment & Plan Note (Addendum)
CAD: Cardiology request to get a copy of all his labs. COPD: On Symbicort uses 1or 2 puffs twice a day, he is actually satisfied with the treatment and desires to continue the same.  If he continued to do well, okay to use inhalers as needed only RTC 6 months

## 2018-09-07 DIAGNOSIS — J449 Chronic obstructive pulmonary disease, unspecified: Secondary | ICD-10-CM | POA: Diagnosis not present

## 2018-09-07 DIAGNOSIS — I10 Essential (primary) hypertension: Secondary | ICD-10-CM | POA: Diagnosis not present

## 2018-09-07 DIAGNOSIS — E785 Hyperlipidemia, unspecified: Secondary | ICD-10-CM | POA: Diagnosis not present

## 2018-09-07 DIAGNOSIS — I2510 Atherosclerotic heart disease of native coronary artery without angina pectoris: Secondary | ICD-10-CM | POA: Diagnosis not present

## 2018-09-07 DIAGNOSIS — I252 Old myocardial infarction: Secondary | ICD-10-CM | POA: Diagnosis not present

## 2018-12-07 DIAGNOSIS — E785 Hyperlipidemia, unspecified: Secondary | ICD-10-CM | POA: Diagnosis not present

## 2018-12-07 DIAGNOSIS — I10 Essential (primary) hypertension: Secondary | ICD-10-CM | POA: Diagnosis not present

## 2018-12-07 DIAGNOSIS — I252 Old myocardial infarction: Secondary | ICD-10-CM | POA: Diagnosis not present

## 2018-12-07 DIAGNOSIS — I2510 Atherosclerotic heart disease of native coronary artery without angina pectoris: Secondary | ICD-10-CM | POA: Diagnosis not present

## 2018-12-07 DIAGNOSIS — J449 Chronic obstructive pulmonary disease, unspecified: Secondary | ICD-10-CM | POA: Diagnosis not present

## 2019-01-07 ENCOUNTER — Ambulatory Visit (INDEPENDENT_AMBULATORY_CARE_PROVIDER_SITE_OTHER): Payer: Medicare Other | Admitting: Internal Medicine

## 2019-01-07 ENCOUNTER — Other Ambulatory Visit: Payer: Self-pay

## 2019-01-07 ENCOUNTER — Encounter: Payer: Self-pay | Admitting: Internal Medicine

## 2019-01-07 DIAGNOSIS — C61 Malignant neoplasm of prostate: Secondary | ICD-10-CM

## 2019-01-07 DIAGNOSIS — I2510 Atherosclerotic heart disease of native coronary artery without angina pectoris: Secondary | ICD-10-CM

## 2019-01-07 DIAGNOSIS — T63441A Toxic effect of venom of bees, accidental (unintentional), initial encounter: Secondary | ICD-10-CM | POA: Diagnosis not present

## 2019-01-07 MED ORDER — EPINEPHRINE 0.3 MG/0.3ML IJ SOAJ: 0.3000 mg | Freq: Once | INTRAMUSCULAR | 2 refills | Status: AC

## 2019-01-07 NOTE — Progress Notes (Signed)
Subjective:    Patient ID: Noah Cochran, male    DOB: [birth year], 68 y.o.   MRN: [id number]  DOS:  01/07/2019 Type of visit - description: Attempted  to make this a video visit, due to technical difficulties from the patient side it was not possible  thus we proceeded with a Virtual Visit via Telephone    I connected with@ on 01/07/19 at  9:00 AM EDT by telephone and verified that I am speaking with the correct person using two identifiers.  THIS ENCOUNTER IS A VIRTUAL VISIT DUE TO COVID-19 - PATIENT WAS NOT SEEN IN THE OFFICE. PATIENT HAS CONSENTED TO VIRTUAL VISIT / TELEMEDICINE VISIT   Location of patient: home  Location of provider: office  I discussed the limitations, risks, security and privacy concerns of performing an evaluation and management service by telephone and the availability of in person appointments. I also discussed with the patient that there may be a patient responsible charge related to this service. The patient expressed understanding and agreed to proceed.   History of Present Illness: Routine visit CAD: Recently saw cardiology, they request labs to be drawn COPD: Good compliance with medications, essentially no symptoms, uses inhalers as needed Prostate cancer: Has lost contact with urology, request my help. Allergies: Requested EpiPen refill. Coronavirus pandemia: Following good practices including social distancing, does not use a mask consistently.  Still working some, in a open environment and only with 2 other people.   Review of Systems Denies fever chills. No chest pain no difficulty breathing No edema No dysuria, gross hematuria or difficulty urinating  Past Medical History:  Diagnosis Date  . 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 Coronary artery disease   . Elevated prostate specific antigen (PSA)   . History of chicken pox   . 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 Prostate cancer [facility])     Past Surgical History:  Procedure  Laterality Date  . CARDIAC CATHETERIZATION N/A 10/18/2016   Procedure: Left Heart Cath and Coronary Angiography;  Surgeon: Charolette Forward, MD;  Location: [place] CV LAB;  Service: Cardiovascular;  Laterality: N/A;  . CORONARY ARTERY BYPASS GRAFT N/A 10/19/2016   Procedure: CORONARY ARTERY BYPASS GRAFTING (CABG) times five using left internal mammary artery and right saphenous vein.  Left mammary artery to Left anterior descending coronary artery, saphenous vein to diagonal, sequential vein to obtuse marginal and distal circumflex arteries, and vein graft to distal right coronary artery.;  Surgeon: [name], MD;  Location: [place];  Service: Open Hea  . ELBOW SURGERY     R elbow  . PROSTATE BIOPSY  01/31/2016  . SHOULDER SURGERY     reconstruction  . TEE WITHOUT CARDIOVERSION N/A 10/19/2016   Procedure: TRANSESOPHAGEAL ECHOCARDIOGRAM (TEE);  Surgeon: [name], MD;  Location: [place];  Service: Open Heart Surgery;  Laterality: N/A;  . TONSILLECTOMY AND ADENOIDECTOMY      Social History   Socioeconomic History  . Marital status: Divorced    Spouse name: Not on file  . Number of children: 1  . Years of education: Not on file  . Highest education level: Not on file  Occupational History  . 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 Financial resource strain: Not on file  . Food insecurity:    Worry: Not on file    Inability: Not on file  . Transportation needs:  Medical: Not on file    Non-medical: Not on file  Tobacco Use  . Smoking status: Former Smoker    Packs/day: 1.00    Years: 40.00    Pack years: 40.00    Types: Cigarettes    Last attempt to quit: 10/12/2016    Years since quitting: 2.2  . Smokeless tobacco: Never Used  . Tobacco comment: quit after STEMI (1ppdx 40 years)  Substance and Sexual Activity  . Alcohol use: Yes    Alcohol/week: 0.0 standard drinks    Comment: beer after work, 0-2-3 /day  . Drug  use: No  . Sexual activity: Yes  Lifestyle  . Physical activity:    Days per week: Not on file    Minutes per session: Not on file  . Stress: Not on file  Relationships  . Social connections:    Talks on phone: Not on file    Gets together: Not on file    Attends religious service: Not on file    Active member of club or organization: Not on file    Attends meetings of clubs or organizations: Not on file    Relationship status: Not on file  . Intimate partner violence:    Fear of current or ex partner: Not on file    Emotionally abused: Not on file    Physically abused: Not on file    Forced sexual activity: Not on file  Other Topics Concern  . Not on file  Social History Narrative   Lives by himself      Allergies as of 01/07/2019      Reactions   Bee Venom Anaphylaxis      Medication List       Accurate as of January 07, 2019  8:58 AM. Always use your most recent med list.        aspirin EC 81 MG tablet Take 81 mg by mouth daily.   atorvastatin 80 MG tablet Commonly known as:  LIPITOR Take 1 tablet (80 mg total) by mouth at bedtime.   budesonide-formoterol 160-4.5 MCG/ACT inhaler Commonly known as:  Symbicort Inhale 2 puffs into the lungs 2 (two) times daily.   EPINEPHrine 0.3 mg/0.3 mL Soaj injection Commonly known as:  EpiPen 2-Pak Inject 0.3 mLs (0.3 mg total) into the muscle once.   losartan 25 MG tablet Commonly known as:  COZAAR Take 25 mg by mouth daily.   metoprolol tartrate 25 MG tablet Commonly known as:  LOPRESSOR Take 1 tablet (25 mg total) by mouth 2 (two) times daily.   NITROGLYCERIN SL Place 1 tablet under the tongue daily as needed.           Objective:   Physical Exam There were no vitals taken for this visit. This was a virtual telephone visit.  Alert oriented x3, in no apparent distress    Assessment     Assessment Allergic reaction to bee stings Prostate cancer dx 2017: rx surveillance  CAD: Dr Terrence Dupont  STEMI - CABG  09-2016 COPD: (+) PFTs 06-2017: Decreased hearing  PLAN Allergic reaction to bees: Refill EpiPen Prostate cancer: Asymptomatic, has lost contact with urology, we agreed on a urology referral, check a PSA. CAD: Asymptomatic, good medication compliance.  His cardiologist requested labs, will do a CMP, FLP.  Fax results to Dr. Terrence Dupont.  Toprol was change to extended release COPD: Asymptomatic, on inhalers as needed Coronavirus education: Continue with social distancing, encouraged the use of a mask RTC tomorrow at 8:30 AM for  blood work Next office visit: CPX 06/2019   I discussed the assessment and treatment plan with the patient. The patient was provided an opportunity to ask questions and all were answered. The patient agreed with the plan and demonstrated an understanding of the instructions.

## 2019-01-08 ENCOUNTER — Other Ambulatory Visit: Payer: Self-pay

## 2019-01-08 ENCOUNTER — Other Ambulatory Visit (INDEPENDENT_AMBULATORY_CARE_PROVIDER_SITE_OTHER): Payer: Medicare Other

## 2019-01-08 DIAGNOSIS — I2510 Atherosclerotic heart disease of native coronary artery without angina pectoris: Secondary | ICD-10-CM

## 2019-01-08 DIAGNOSIS — C61 Malignant neoplasm of prostate: Secondary | ICD-10-CM | POA: Diagnosis not present

## 2019-01-08 LAB — LIPID PANEL
Cholesterol: 173 mg/dL (ref 0–200)
HDL: 84.2 mg/dL (ref >39.00)
LDL Cholesterol: 72 mg/dL (ref 0–99)
NonHDL: 89.15
Total CHOL/HDL Ratio: 2
Triglycerides: 84 mg/dL (ref 0.0–149.0)
VLDL: 16.8 mg/dL (ref 0.0–40.0)

## 2019-01-08 LAB — COMPREHENSIVE METABOLIC PANEL
ALT: 20 U/L (ref 0–53)
AST: 29 U/L (ref 0–37)
Albumin: 4.5 g/dL (ref 3.5–5.2)
Alkaline Phosphatase: 78 U/L (ref 39–117)
BUN: 17 mg/dL (ref 6–23)
CO2: 26 mEq/L (ref 19–32)
Calcium: 9.5 mg/dL (ref 8.4–10.5)
Chloride: 101 mEq/L (ref 96–112)
Creatinine, Ser: 1.07 mg/dL (ref 0.40–1.50)
GFR: 69.05 mL/min (ref >60.00)
Glucose, Bld: 86 mg/dL (ref 70–99)
Potassium: 4.3 mEq/L (ref 3.5–5.1)
Sodium: 139 mEq/L (ref 135–145)
Total Bilirubin: 0.7 mg/dL (ref 0.2–1.2)
Total Protein: 7.4 g/dL (ref 6.0–8.3)

## 2019-01-08 LAB — PSA: PSA: 9.03 ng/mL — ABNORMAL HIGH (ref 0.10–4.00)

## 2019-01-08 NOTE — Assessment & Plan Note (Signed)
Allergic reaction to bees: Refill EpiPen Prostate cancer: Asymptomatic, has lost contact with urology, we agreed on a urology referral, check a PSA. CAD: Asymptomatic, good medication compliance.  His cardiologist requested labs, will do a CMP, FLP.  Fax results to Dr. Terrence Dupont.  Toprol was change to extended release COPD: Asymptomatic, on inhalers as needed Coronavirus education: Continue with social distancing, encouraged the use of a mask RTC tomorrow at 8:30 AM for blood work Next office visit: CPX 06/2019

## 2019-01-22 ENCOUNTER — Other Ambulatory Visit: Payer: Self-pay | Admitting: Urology

## 2019-01-22 DIAGNOSIS — C61 Malignant neoplasm of prostate: Secondary | ICD-10-CM

## 2019-02-16 ENCOUNTER — Other Ambulatory Visit: Payer: Medicare Other

## 2019-02-16 ENCOUNTER — Ambulatory Visit
Admission: RE | Admit: 2019-02-16 | Discharge: 2019-02-16 | Disposition: A | Discharge status: Home / Self Care | Payer: Medicare Other | Source: Ambulatory Visit | Attending: Urology | Admitting: Urology

## 2019-02-16 ENCOUNTER — Other Ambulatory Visit: Payer: Self-pay

## 2019-02-16 DIAGNOSIS — C61 Malignant neoplasm of prostate: Secondary | ICD-10-CM

## 2019-02-16 MED ORDER — GADOBENATE DIMEGLUMINE 529 MG/ML IV SOLN
10.0000 mL | Freq: Once | INTRAVENOUS | Status: AC | PRN
  Administered 2019-02-16: 11:00:00 10 mL via INTRAVENOUS

## 2019-03-08 DIAGNOSIS — I252 Old myocardial infarction: Secondary | ICD-10-CM | POA: Diagnosis not present

## 2019-03-08 DIAGNOSIS — I2510 Atherosclerotic heart disease of native coronary artery without angina pectoris: Secondary | ICD-10-CM | POA: Diagnosis not present

## 2019-03-08 DIAGNOSIS — I10 Essential (primary) hypertension: Secondary | ICD-10-CM | POA: Diagnosis not present

## 2019-03-08 DIAGNOSIS — E785 Hyperlipidemia, unspecified: Secondary | ICD-10-CM | POA: Diagnosis not present

## 2019-03-10 LAB — PSA: PSA: 7.98

## 2019-04-15 ENCOUNTER — Telehealth: Payer: Self-pay | Admitting: Internal Medicine

## 2019-04-15 NOTE — Telephone Encounter (Signed)
Gwen- can you check status of urology referral that was placed in 12/2018?

## 2019-04-15 NOTE — Telephone Encounter (Signed)
Last PSA was elevated, was referred to urology, please contact patient, has he been able to see them?   Do we need to send another referral?

## 2019-04-27 NOTE — Telephone Encounter (Signed)
Will request last ov notes. Thanks.

## 2019-04-27 NOTE — Telephone Encounter (Signed)
Received last few OV notes- placed in MD red folder.

## 2019-04-27 NOTE — Telephone Encounter (Signed)
Pt was seem 5/1, 6/16, and has another appt in Sept. Pt was already established

## 2019-04-28 ENCOUNTER — Encounter: Payer: Self-pay | Admitting: Internal Medicine

## 2019-04-28 NOTE — Telephone Encounter (Signed)
Urology office visit note from 01/22/2019 reviewed. They notice his PSA to be elevated plan at a prostate MRI which was done 02/16/2019.  In the note they also were planning a biopsy. No procedure or biopsy report available Subsequently on 03/10/2019, PSA was 7.98.

## 2019-06-01 ENCOUNTER — Other Ambulatory Visit: Payer: Self-pay | Admitting: Internal Medicine

## 2019-06-07 DIAGNOSIS — E785 Hyperlipidemia, unspecified: Secondary | ICD-10-CM | POA: Diagnosis not present

## 2019-06-07 DIAGNOSIS — I2510 Atherosclerotic heart disease of native coronary artery without angina pectoris: Secondary | ICD-10-CM | POA: Diagnosis not present

## 2019-06-07 DIAGNOSIS — J449 Chronic obstructive pulmonary disease, unspecified: Secondary | ICD-10-CM | POA: Diagnosis not present

## 2019-06-07 DIAGNOSIS — I10 Essential (primary) hypertension: Secondary | ICD-10-CM | POA: Diagnosis not present

## 2019-06-10 LAB — PSA: PSA: 8.05

## 2019-06-16 DIAGNOSIS — R8271 Bacteriuria: Secondary | ICD-10-CM | POA: Diagnosis not present

## 2019-06-24 ENCOUNTER — Encounter: Payer: Self-pay | Admitting: Internal Medicine

## 2019-07-23 IMAGING — MR MR PROSTATE WO/W CM
56 series · 56 of 56 positions shown · IV contrast (10 ml multihance)
Comparison: None.

CLINICAL DATA: Prostate cancer diagnosed on 07/02/2017. Gleason
3+3=6 prostate cancer involving 5% of 1 core at the right apex and
5% of 1 core at the right mid lateral gland. Gleason 3+3=6 involving
20% of 1 core at the left base. PSA on 01/08/2019 was 9.03.

EXAM:
MR PROSTATE WITHOUT AND WITH CONTRAST
TECHNIQUE: Multiplanar multisequence MRI images were obtained of the pelvis
centered about the prostate. Pre and post contrast images were
obtained.
CONTRAST:  10mL MULTIHANCE GADOBENATE DIMEGLUMINE 529 MG/ML IV SOLN

[Series 3: T1 · axial · 8.0mm · 1.06mm/px · 1 of 28 slices shown (1 of 2)]
[im 1/28]
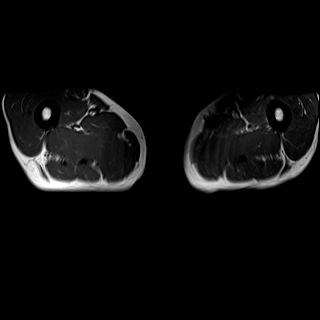

[Series 4: bSSFP fat-sat · axial · 8.0mm · 0.74mm/px · 1 of 28 slices shown]
[im 1/28]
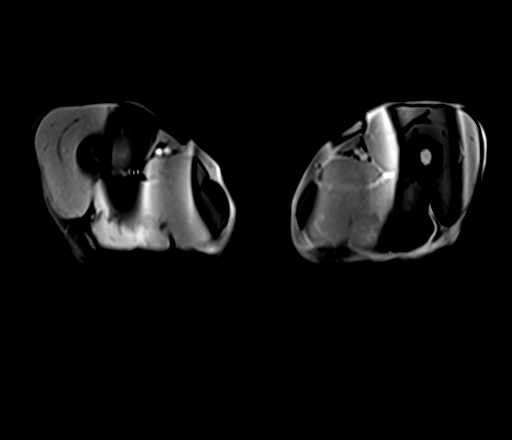

[Series 5: T2 · sagittal · 3.5mm · 0.56mm/px · 1 of 41 slices shown (1 of 4)]
[im 1/41]
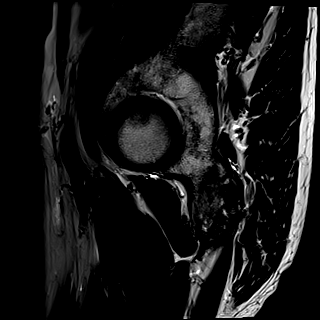

[Series 6: T1 · axial · 3.0mm · 0.31mm/px · 1 of 22 slices shown (2 of 2)]
[im 1/22]
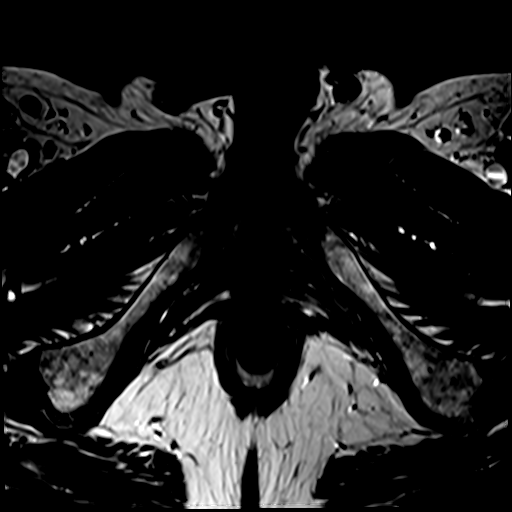

[Series 7: T2 · axial · 3.5mm · 0.56mm/px · 1 of 21 slices shown (2 of 4)]
[im 1/21]
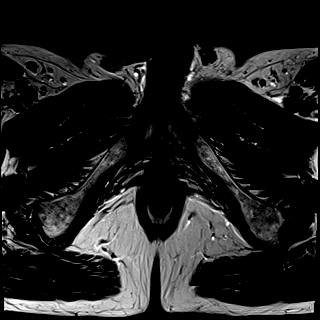

[Series 8: T2 · axial · 1.0mm · 1.04mm/px · 1 of 64 slices shown (3 of 4)]
[im 1/64]
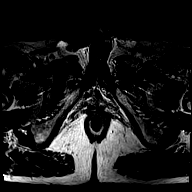

[Series 9: T2 · coronal · 3.5mm · 0.56mm/px · 1 of 18 slices shown (4 of 4)]
[im 1/18]
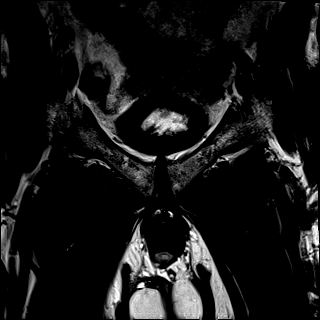

[Series 10: DWI · axial · 3.5mm · 1.56mm/px · 1 of 54 slices shown (1 of 2)]
[im 1/54]
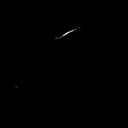

[Series 11: DWI · axial · 3.5mm · 1.56mm/px · 1 of 16 slices shown (2 of 2)]
[im 1/16]
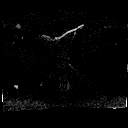

[Series 12: pre t1_twist_tra_dyn_ttc=5.8s · axial · non-contrast · 3.5mm · 0.83mm/px · 1 of 22 slices shown]
[im 1/22]
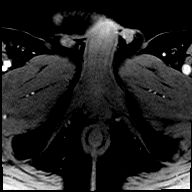

[Series 13: post t1_twist_tra_dyn-copy center · axial · 3.5mm · 0.83mm/px · 1 of 22 slices shown (1 of 24)]
[im 1/22]
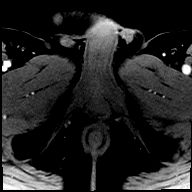

[Series 14: post t1_twist_tra_dyn-copy center · axial · 3.5mm · 0.83mm/px · 1 of 22 slices shown (2 of 24)]
[im 1/22]
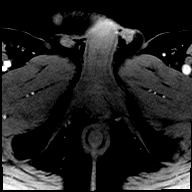

[Series 15: post t1_twist_tra_dyn-copy cent_sub_ttc=(id) · axial · 3.5mm · 0.83mm/px · 1 of 18 slices shown (1 of 22)]
[im 1/18]
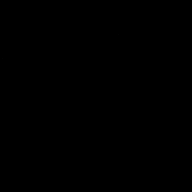

[Series 16: post t1_twist_tra_dyn-copy center · axial · 3.5mm · 0.83mm/px · 1 of 22 slices shown (3 of 24)]
[im 1/22]
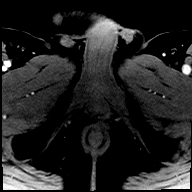

[Series 17: post t1_twist_tra_dyn-copy cent_sub_ttc=(id) · axial · 3.5mm · 0.83mm/px · 1 of 22 slices shown (2 of 22)]
[im 1/22]
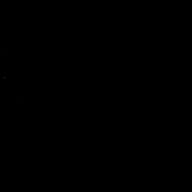

[Series 18: post t1_twist_tra_dyn-copy center · axial · 3.5mm · 0.83mm/px · 1 of 22 slices shown (4 of 24)]
[im 1/22]
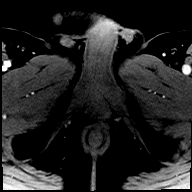

[Series 19: post t1_twist_tra_dyn-copy cent_sub_ttc=(id) · axial · 3.5mm · 0.83mm/px · 1 of 22 slices shown (3 of 22)]
[im 1/22]
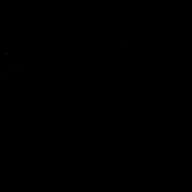

[Series 20: post t1_twist_tra_dyn-copy center · axial · 3.5mm · 0.83mm/px · 1 of 22 slices shown (5 of 24)]
[im 1/22]
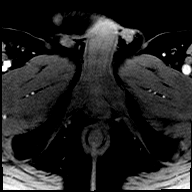

[Series 21: post t1_twist_tra_dyn-copy cent_sub_ttc=(id) · axial · 3.5mm · 0.83mm/px · 1 of 22 slices shown (4 of 22)]
[im 1/22]
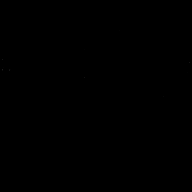

[Series 22: post t1_twist_tra_dyn-copy center · axial · 3.5mm · 0.83mm/px · 1 of 22 slices shown (6 of 24)]
[im 1/22]
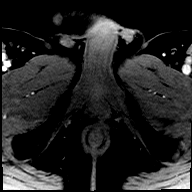

[Series 23: post t1_twist_tra_dyn-copy cent_sub_ttc=(id) · axial · 3.5mm · 0.83mm/px · 1 of 22 slices shown (5 of 22)]
[im 1/22]
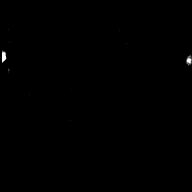

[Series 24: post t1_twist_tra_dyn-copy center · axial · 3.5mm · 0.83mm/px · 1 of 22 slices shown (7 of 24)]
[im 1/22]
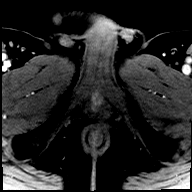

[Series 25: post t1_twist_tra_dyn-copy cent_sub_ttc=(id) · axial · 3.5mm · 0.83mm/px · 1 of 22 slices shown (6 of 22)]
[im 1/22]
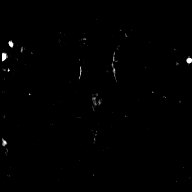

[Series 26: post t1_twist_tra_dyn-copy center · axial · 3.5mm · 0.83mm/px · 1 of 22 slices shown (8 of 24)]
[im 1/22]
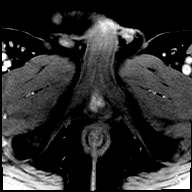

[Series 27: post t1_twist_tra_dyn-copy cent_sub_ttc=(id) · axial · 3.5mm · 0.83mm/px · 1 of 22 slices shown (7 of 22)]
[im 1/22]
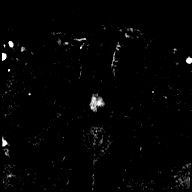

[Series 28: post t1_twist_tra_dyn-copy center · axial · 3.5mm · 0.83mm/px · 1 of 22 slices shown (9 of 24)]
[im 1/22]
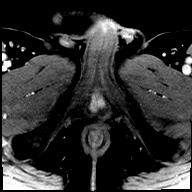

[Series 29: post t1_twist_tra_dyn-copy cent_sub_ttc=(id) · axial · 3.5mm · 0.83mm/px · 1 of 22 slices shown (8 of 22)]
[im 1/22]
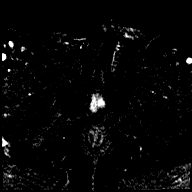

[Series 30: post t1_twist_tra_dyn-copy center · axial · 3.5mm · 0.83mm/px · 1 of 22 slices shown (10 of 24)]
[im 1/22]
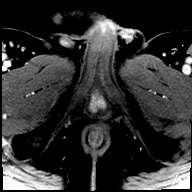

[Series 31: post t1_twist_tra_dyn-copy cent_sub_ttc=(id) · axial · 3.5mm · 0.83mm/px · 1 of 22 slices shown (9 of 22)]
[im 1/22]
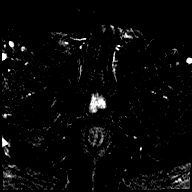

[Series 32: post t1_twist_tra_dyn-copy center · axial · 3.5mm · 0.83mm/px · 1 of 22 slices shown (11 of 24)]
[im 1/22]
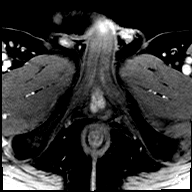

[Series 33: post t1_twist_tra_dyn-copy cent_sub_ttc=(id) · axial · 3.5mm · 0.83mm/px · 1 of 22 slices shown (10 of 22)]
[im 1/22]
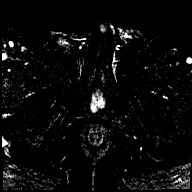

[Series 34: post t1_twist_tra_dyn-copy center · axial · 3.5mm · 0.83mm/px · 1 of 22 slices shown (12 of 24)]
[im 1/22]
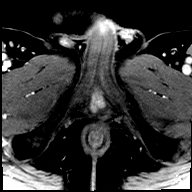

[Series 35: post t1_twist_tra_dyn-copy cent_sub_ttc=(id) · axial · 3.5mm · 0.83mm/px · 1 of 22 slices shown (11 of 22)]
[im 1/22]
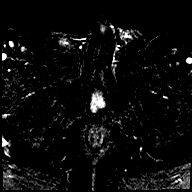

[Series 36: post t1_twist_tra_dyn-copy center · axial · 3.5mm · 0.83mm/px · 1 of 22 slices shown (13 of 24)]
[im 1/22]
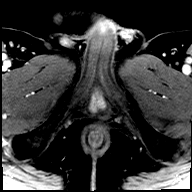

[Series 37: post t1_twist_tra_dyn-copy cent_sub_ttc=(id) · axial · 3.5mm · 0.83mm/px · 1 of 22 slices shown (12 of 22)]
[im 1/22]
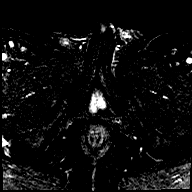

[Series 38: post t1_twist_tra_dyn-copy center · axial · 3.5mm · 0.83mm/px · 1 of 22 slices shown (14 of 24)]
[im 1/22]
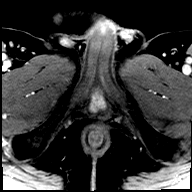

[Series 39: post t1_twist_tra_dyn-copy cent_sub_ttc=(id) · axial · 3.5mm · 0.83mm/px · 1 of 22 slices shown (13 of 22)]
[im 1/22]
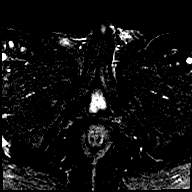

[Series 40: post t1_twist_tra_dyn-copy center · axial · 3.5mm · 0.83mm/px · 1 of 22 slices shown (15 of 24)]
[im 1/22]
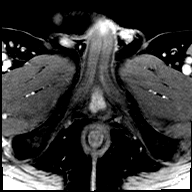

[Series 41: post t1_twist_tra_dyn-copy cent_sub_ttc=(id) · axial · 3.5mm · 0.83mm/px · 1 of 22 slices shown (14 of 22)]
[im 1/22]
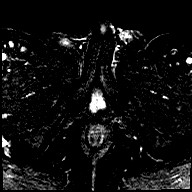

[Series 42: post t1_twist_tra_dyn-copy center · axial · 3.5mm · 0.83mm/px · 1 of 22 slices shown (16 of 24)]
[im 1/22]
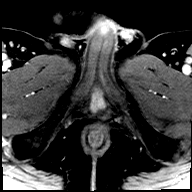

[Series 43: post t1_twist_tra_dyn-copy cent_sub_ttc=(id) · axial · 3.5mm · 0.83mm/px · 1 of 22 slices shown (15 of 22)]
[im 1/22]
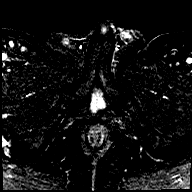

[Series 44: post t1_twist_tra_dyn-copy center · axial · 3.5mm · 0.83mm/px · 1 of 22 slices shown (17 of 24)]
[im 1/22]
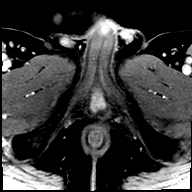

[Series 45: post t1_twist_tra_dyn-copy cent_sub_ttc=(id) · axial · 3.5mm · 0.83mm/px · 1 of 22 slices shown (16 of 22)]
[im 1/22]
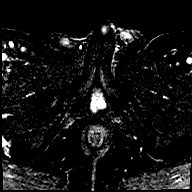

[Series 46: post t1_twist_tra_dyn-copy center · axial · 3.5mm · 0.83mm/px · 1 of 22 slices shown (18 of 24)]
[im 1/22]
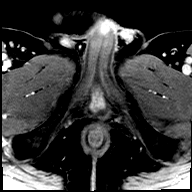

[Series 47: post t1_twist_tra_dyn-copy cent_sub_ttc=(id) · axial · 3.5mm · 0.83mm/px · 1 of 22 slices shown (17 of 22)]
[im 1/22]
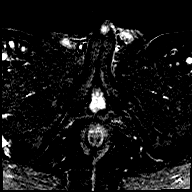

[Series 48: post t1_twist_tra_dyn-copy center · axial · 3.5mm · 0.83mm/px · 1 of 22 slices shown (19 of 24)]
[im 1/22]
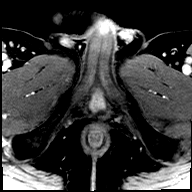

[Series 49: post t1_twist_tra_dyn-copy cent_sub_ttc=(id) · axial · 3.5mm · 0.83mm/px · 1 of 22 slices shown (18 of 22)]
[im 1/22]
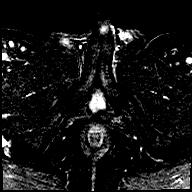

[Series 50: post t1_twist_tra_dyn-copy center · axial · 3.5mm · 0.83mm/px · 1 of 22 slices shown (20 of 24)]
[im 1/22]
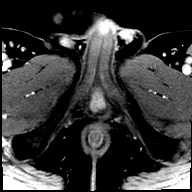

[Series 51: post t1_twist_tra_dyn-copy cent_sub_ttc=(id) · axial · 3.5mm · 0.83mm/px · 1 of 22 slices shown (19 of 22)]
[im 1/22]
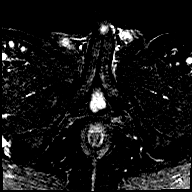

[Series 52: post t1_twist_tra_dyn-copy center · axial · 3.5mm · 0.83mm/px · 1 of 22 slices shown (21 of 24)]
[im 1/22]
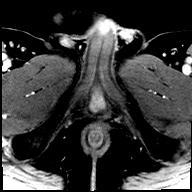

[Series 53: post t1_twist_tra_dyn-copy cent_sub_ttc=(id) · axial · 3.5mm · 0.83mm/px · 1 of 22 slices shown (20 of 22)]
[im 1/22]
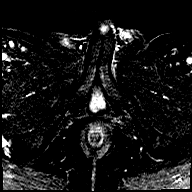

[Series 54: post t1_twist_tra_dyn-copy center · axial · 3.5mm · 0.83mm/px · 1 of 22 slices shown (22 of 24)]
[im 1/22]
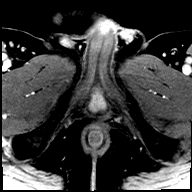

[Series 55: post t1_twist_tra_dyn-copy cent_sub_ttc=(id) · axial · 3.5mm · 0.83mm/px · 1 of 22 slices shown (21 of 22)]
[im 1/22]
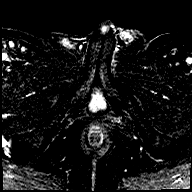

[Series 56: post t1_twist_tra_dyn-copy center · axial · 3.5mm · 0.83mm/px · 1 of 22 slices shown (23 of 24)]
[im 1/22]
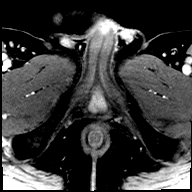

[Series 57: post t1_twist_tra_dyn-copy cent_sub_ttc=(id) · axial · 3.5mm · 0.83mm/px · 1 of 22 slices shown (22 of 22)]
[im 1/22]
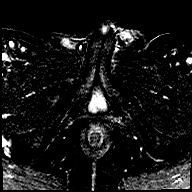

[Series 58: post t1_twist_tra_dyn-copy center · axial · 3.5mm · 0.83mm/px · 1 of 22 slices shown (24 of 24)]
[im 1/22]
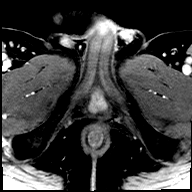

[56 of 56 positions shown; findings below may reference images not displayed]

FINDINGS: Prostate: A PI-RADS category 2 lesion of the right posterolateral
mid gland is designated as region of interest # 1 demonstrates
linear bandlike low T2 signal and subtle enhancement but no masslike
appearance. This measures 0.07 cubic cm (0.7 by 0.5 by 0.2 cm).

No suspicious or PI-RADS category 3 or above lesions are identified
in the prostate gland.

Volume: 3D volumetric analysis: 29.94 cubic cm (5.2 by 3.5 by
cm)

Transcapsular spread:  Absent

Seminal vesicle involvement: Absent

Neurovascular bundle involvement: Absent

Pelvic adenopathy: Absent

Bone metastasis: Absent

Other findings: Relatively empty urinary bladder. Small bone island
in the left pubic body.
IMPRESSION: 1. No lesions of intermediate or high suspicion are identified.
There is a small PI-RADS category 2 lesion in the right
posterolateral mid gland which is designated as region of interest #
1 but is probably benign based on imaging appearance.
2. Prostate volume approximately 30 cubic cm.

## 2019-08-04 ENCOUNTER — Telehealth: Payer: Self-pay | Admitting: Internal Medicine

## 2019-08-04 DIAGNOSIS — Z122 Encounter for screening for malignant neoplasm of respiratory organs: Secondary | ICD-10-CM

## 2019-08-04 NOTE — Telephone Encounter (Signed)
Please arrange a low dose CT for lung cancer screening

## 2019-08-04 NOTE — Telephone Encounter (Signed)
Order placed

## 2019-09-06 DIAGNOSIS — J449 Chronic obstructive pulmonary disease, unspecified: Secondary | ICD-10-CM | POA: Diagnosis not present

## 2019-09-06 DIAGNOSIS — I10 Essential (primary) hypertension: Secondary | ICD-10-CM | POA: Diagnosis not present

## 2019-09-06 DIAGNOSIS — E785 Hyperlipidemia, unspecified: Secondary | ICD-10-CM | POA: Diagnosis not present

## 2019-09-06 DIAGNOSIS — I2510 Atherosclerotic heart disease of native coronary artery without angina pectoris: Secondary | ICD-10-CM | POA: Diagnosis not present

## 2019-09-06 NOTE — Telephone Encounter (Signed)
Per Kathlene November , The left messages but patient  did not return calls.  I will discuss at the next opportunity.

## 2019-11-25 ENCOUNTER — Ambulatory Visit: Payer: Medicare Other | Attending: Internal Medicine

## 2019-11-25 DIAGNOSIS — Z23 Encounter for immunization: Secondary | ICD-10-CM | POA: Insufficient documentation

## 2019-11-25 NOTE — Progress Notes (Addendum)
   Covid-19 Vaccination Clinic  Name:  Noah Cochran    MRN: [id number] DOB: Feb 19, 1951  11/25/2019  [name] was observed post Covid-19 immunization for 30 minutes based on pre-vaccination screening without incident. He was provided with Vaccine Information Sheet and instruction to access the V-Safe system.   [name] was instructed to call 911 with any severe reactions post vaccine: Marland Kitchen Difficulty breathing  . Swelling of face and throat  . A fast heartbeat  . A bad rash all over body  . Dizziness and weakness   Immunizations Administered    Name Date Dose VIS Date Route   Pfizer COVID-19 Vaccine 11/25/2019  3:17 PM 0.3 mL 09/03/2019 Intramuscular   Manufacturer: [place]   Lot: [id number]   [place]: [id number]

## 2019-12-06 DIAGNOSIS — I10 Essential (primary) hypertension: Secondary | ICD-10-CM | POA: Diagnosis not present

## 2019-12-06 DIAGNOSIS — I2510 Atherosclerotic heart disease of native coronary artery without angina pectoris: Secondary | ICD-10-CM | POA: Diagnosis not present

## 2019-12-06 DIAGNOSIS — E785 Hyperlipidemia, unspecified: Secondary | ICD-10-CM | POA: Diagnosis not present

## 2019-12-06 DIAGNOSIS — J449 Chronic obstructive pulmonary disease, unspecified: Secondary | ICD-10-CM | POA: Diagnosis not present

## 2019-12-22 ENCOUNTER — Ambulatory Visit: Payer: Medicare Other | Attending: Internal Medicine

## 2019-12-22 DIAGNOSIS — Z23 Encounter for immunization: Secondary | ICD-10-CM

## 2019-12-22 NOTE — Progress Notes (Signed)
   Covid-19 Vaccination Clinic  Name:  NAIIM MACEDO    MRN: [id number] DOB: 1951/01/07  12/22/2019  Mr. Vantassell was observed post Covid-19 immunization for 30 minutes based on pre-vaccination screening without incident. He was provided with Vaccine Information Sheet and instruction to access the V-Safe system.   Mr. Dendinger was instructed to call 911 with any severe reactions post vaccine: Marland Kitchen Difficulty breathing  . Swelling of face and throat  . A fast heartbeat  . A bad rash all over body  . Dizziness and weakness   Immunizations Administered    Name Date Dose VIS Date Route   Pfizer COVID-19 Vaccine 12/22/2019  9:18 AM 0.3 mL 09/03/2019 Intramuscular   Manufacturer: [place]   Lot: [id number]   [place]: [id number]

## 2020-02-09 DIAGNOSIS — I2510 Atherosclerotic heart disease of native coronary artery without angina pectoris: Secondary | ICD-10-CM | POA: Diagnosis not present

## 2020-02-09 DIAGNOSIS — I10 Essential (primary) hypertension: Secondary | ICD-10-CM | POA: Diagnosis not present

## 2020-02-09 DIAGNOSIS — E785 Hyperlipidemia, unspecified: Secondary | ICD-10-CM | POA: Diagnosis not present

## 2020-03-06 DIAGNOSIS — E785 Hyperlipidemia, unspecified: Secondary | ICD-10-CM | POA: Diagnosis not present

## 2020-03-06 DIAGNOSIS — J449 Chronic obstructive pulmonary disease, unspecified: Secondary | ICD-10-CM | POA: Diagnosis not present

## 2020-03-06 DIAGNOSIS — I10 Essential (primary) hypertension: Secondary | ICD-10-CM | POA: Diagnosis not present

## 2020-03-06 DIAGNOSIS — I2510 Atherosclerotic heart disease of native coronary artery without angina pectoris: Secondary | ICD-10-CM | POA: Diagnosis not present

## 2020-03-06 DIAGNOSIS — N189 Chronic kidney disease, unspecified: Secondary | ICD-10-CM | POA: Diagnosis not present

## 2020-05-31 DIAGNOSIS — I10 Essential (primary) hypertension: Secondary | ICD-10-CM | POA: Diagnosis not present

## 2020-06-07 DIAGNOSIS — I2510 Atherosclerotic heart disease of native coronary artery without angina pectoris: Secondary | ICD-10-CM | POA: Diagnosis not present

## 2020-06-07 DIAGNOSIS — I10 Essential (primary) hypertension: Secondary | ICD-10-CM | POA: Diagnosis not present

## 2020-06-07 DIAGNOSIS — E785 Hyperlipidemia, unspecified: Secondary | ICD-10-CM | POA: Diagnosis not present

## 2020-06-07 DIAGNOSIS — F17290 Nicotine dependence, other tobacco product, uncomplicated: Secondary | ICD-10-CM | POA: Diagnosis not present

## 2020-06-07 DIAGNOSIS — N189 Chronic kidney disease, unspecified: Secondary | ICD-10-CM | POA: Diagnosis not present

## 2020-08-05 ENCOUNTER — Other Ambulatory Visit: Payer: Self-pay | Admitting: Internal Medicine

## 2020-09-06 DIAGNOSIS — I10 Essential (primary) hypertension: Secondary | ICD-10-CM | POA: Diagnosis not present

## 2020-09-06 DIAGNOSIS — J449 Chronic obstructive pulmonary disease, unspecified: Secondary | ICD-10-CM | POA: Diagnosis not present

## 2020-09-06 DIAGNOSIS — E785 Hyperlipidemia, unspecified: Secondary | ICD-10-CM | POA: Diagnosis not present

## 2020-09-06 DIAGNOSIS — I2510 Atherosclerotic heart disease of native coronary artery without angina pectoris: Secondary | ICD-10-CM | POA: Diagnosis not present

## 2020-12-04 ENCOUNTER — Encounter: Payer: Self-pay | Admitting: Internal Medicine

## 2020-12-06 DIAGNOSIS — I2510 Atherosclerotic heart disease of native coronary artery without angina pectoris: Secondary | ICD-10-CM | POA: Diagnosis not present

## 2020-12-06 DIAGNOSIS — E785 Hyperlipidemia, unspecified: Secondary | ICD-10-CM | POA: Diagnosis not present

## 2020-12-06 DIAGNOSIS — I10 Essential (primary) hypertension: Secondary | ICD-10-CM | POA: Diagnosis not present

## 2020-12-06 DIAGNOSIS — N189 Chronic kidney disease, unspecified: Secondary | ICD-10-CM | POA: Diagnosis not present

## 2021-02-27 DIAGNOSIS — I2510 Atherosclerotic heart disease of native coronary artery without angina pectoris: Secondary | ICD-10-CM | POA: Diagnosis not present

## 2021-02-27 DIAGNOSIS — E785 Hyperlipidemia, unspecified: Secondary | ICD-10-CM | POA: Diagnosis not present

## 2021-02-27 DIAGNOSIS — I10 Essential (primary) hypertension: Secondary | ICD-10-CM | POA: Diagnosis not present

## 2021-03-13 DIAGNOSIS — E785 Hyperlipidemia, unspecified: Secondary | ICD-10-CM | POA: Diagnosis not present

## 2021-03-13 DIAGNOSIS — I10 Essential (primary) hypertension: Secondary | ICD-10-CM | POA: Diagnosis not present

## 2021-03-13 DIAGNOSIS — I2510 Atherosclerotic heart disease of native coronary artery without angina pectoris: Secondary | ICD-10-CM | POA: Diagnosis not present

## 2021-03-13 DIAGNOSIS — N189 Chronic kidney disease, unspecified: Secondary | ICD-10-CM | POA: Diagnosis not present

## 2021-07-02 DIAGNOSIS — F17290 Nicotine dependence, other tobacco product, uncomplicated: Secondary | ICD-10-CM | POA: Diagnosis not present

## 2021-07-02 DIAGNOSIS — E785 Hyperlipidemia, unspecified: Secondary | ICD-10-CM | POA: Diagnosis not present

## 2021-07-02 DIAGNOSIS — I10 Essential (primary) hypertension: Secondary | ICD-10-CM | POA: Diagnosis not present

## 2021-07-02 DIAGNOSIS — I2510 Atherosclerotic heart disease of native coronary artery without angina pectoris: Secondary | ICD-10-CM | POA: Diagnosis not present

## 2021-09-20 DIAGNOSIS — I10 Essential (primary) hypertension: Secondary | ICD-10-CM | POA: Diagnosis not present

## 2021-09-20 DIAGNOSIS — I2510 Atherosclerotic heart disease of native coronary artery without angina pectoris: Secondary | ICD-10-CM | POA: Diagnosis not present

## 2021-09-20 DIAGNOSIS — E785 Hyperlipidemia, unspecified: Secondary | ICD-10-CM | POA: Diagnosis not present

## 2021-10-01 DIAGNOSIS — F17290 Nicotine dependence, other tobacco product, uncomplicated: Secondary | ICD-10-CM | POA: Diagnosis not present

## 2021-10-01 DIAGNOSIS — I2510 Atherosclerotic heart disease of native coronary artery without angina pectoris: Secondary | ICD-10-CM | POA: Diagnosis not present

## 2021-10-01 DIAGNOSIS — I10 Essential (primary) hypertension: Secondary | ICD-10-CM | POA: Diagnosis not present

## 2021-10-01 DIAGNOSIS — E785 Hyperlipidemia, unspecified: Secondary | ICD-10-CM | POA: Diagnosis not present

## 2021-12-31 DIAGNOSIS — E785 Hyperlipidemia, unspecified: Secondary | ICD-10-CM | POA: Diagnosis not present

## 2021-12-31 DIAGNOSIS — I2510 Atherosclerotic heart disease of native coronary artery without angina pectoris: Secondary | ICD-10-CM | POA: Diagnosis not present

## 2021-12-31 DIAGNOSIS — N189 Chronic kidney disease, unspecified: Secondary | ICD-10-CM | POA: Diagnosis not present

## 2021-12-31 DIAGNOSIS — I10 Essential (primary) hypertension: Secondary | ICD-10-CM | POA: Diagnosis not present

## 2022-04-01 DIAGNOSIS — F17290 Nicotine dependence, other tobacco product, uncomplicated: Secondary | ICD-10-CM | POA: Diagnosis not present

## 2022-04-01 DIAGNOSIS — I2510 Atherosclerotic heart disease of native coronary artery without angina pectoris: Secondary | ICD-10-CM | POA: Diagnosis not present

## 2022-04-01 DIAGNOSIS — I10 Essential (primary) hypertension: Secondary | ICD-10-CM | POA: Diagnosis not present

## 2022-04-01 DIAGNOSIS — E785 Hyperlipidemia, unspecified: Secondary | ICD-10-CM | POA: Diagnosis not present

## 2022-07-01 DIAGNOSIS — I2510 Atherosclerotic heart disease of native coronary artery without angina pectoris: Secondary | ICD-10-CM | POA: Diagnosis not present

## 2022-07-01 DIAGNOSIS — I10 Essential (primary) hypertension: Secondary | ICD-10-CM | POA: Diagnosis not present

## 2022-07-01 DIAGNOSIS — E785 Hyperlipidemia, unspecified: Secondary | ICD-10-CM | POA: Diagnosis not present

## 2022-07-01 DIAGNOSIS — F17290 Nicotine dependence, other tobacco product, uncomplicated: Secondary | ICD-10-CM | POA: Diagnosis not present

## 2022-09-24 DIAGNOSIS — I2510 Atherosclerotic heart disease of native coronary artery without angina pectoris: Secondary | ICD-10-CM | POA: Diagnosis not present

## 2022-09-24 DIAGNOSIS — I10 Essential (primary) hypertension: Secondary | ICD-10-CM | POA: Diagnosis not present

## 2022-09-24 DIAGNOSIS — E785 Hyperlipidemia, unspecified: Secondary | ICD-10-CM | POA: Diagnosis not present

## 2022-09-25 DIAGNOSIS — I2510 Atherosclerotic heart disease of native coronary artery without angina pectoris: Secondary | ICD-10-CM | POA: Diagnosis not present

## 2022-09-25 DIAGNOSIS — E785 Hyperlipidemia, unspecified: Secondary | ICD-10-CM | POA: Diagnosis not present

## 2022-09-25 DIAGNOSIS — I10 Essential (primary) hypertension: Secondary | ICD-10-CM | POA: Diagnosis not present

## 2022-09-25 DIAGNOSIS — N189 Chronic kidney disease, unspecified: Secondary | ICD-10-CM | POA: Diagnosis not present

## 2022-12-31 DIAGNOSIS — E785 Hyperlipidemia, unspecified: Secondary | ICD-10-CM | POA: Diagnosis not present

## 2022-12-31 DIAGNOSIS — I10 Essential (primary) hypertension: Secondary | ICD-10-CM | POA: Diagnosis not present

## 2022-12-31 DIAGNOSIS — N189 Chronic kidney disease, unspecified: Secondary | ICD-10-CM | POA: Diagnosis not present

## 2022-12-31 DIAGNOSIS — I2510 Atherosclerotic heart disease of native coronary artery without angina pectoris: Secondary | ICD-10-CM | POA: Diagnosis not present

## 2023-04-01 DIAGNOSIS — N182 Chronic kidney disease, stage 2 (mild): Secondary | ICD-10-CM | POA: Diagnosis not present

## 2023-04-01 DIAGNOSIS — I2510 Atherosclerotic heart disease of native coronary artery without angina pectoris: Secondary | ICD-10-CM | POA: Diagnosis not present

## 2023-04-01 DIAGNOSIS — I10 Essential (primary) hypertension: Secondary | ICD-10-CM | POA: Diagnosis not present

## 2023-04-01 DIAGNOSIS — E782 Mixed hyperlipidemia: Secondary | ICD-10-CM | POA: Diagnosis not present

## 2023-06-26 DIAGNOSIS — I10 Essential (primary) hypertension: Secondary | ICD-10-CM | POA: Diagnosis not present

## 2023-06-26 DIAGNOSIS — E785 Hyperlipidemia, unspecified: Secondary | ICD-10-CM | POA: Diagnosis not present

## 2023-06-26 DIAGNOSIS — I2510 Atherosclerotic heart disease of native coronary artery without angina pectoris: Secondary | ICD-10-CM | POA: Diagnosis not present

## 2023-07-01 DIAGNOSIS — I2510 Atherosclerotic heart disease of native coronary artery without angina pectoris: Secondary | ICD-10-CM | POA: Diagnosis not present

## 2023-07-01 DIAGNOSIS — I10 Essential (primary) hypertension: Secondary | ICD-10-CM | POA: Diagnosis not present

## 2023-07-01 DIAGNOSIS — E782 Mixed hyperlipidemia: Secondary | ICD-10-CM | POA: Diagnosis not present

## 2023-07-01 DIAGNOSIS — N182 Chronic kidney disease, stage 2 (mild): Secondary | ICD-10-CM | POA: Diagnosis not present

## 2023-09-30 DIAGNOSIS — E782 Mixed hyperlipidemia: Secondary | ICD-10-CM | POA: Diagnosis not present

## 2023-09-30 DIAGNOSIS — I2510 Atherosclerotic heart disease of native coronary artery without angina pectoris: Secondary | ICD-10-CM | POA: Diagnosis not present

## 2023-09-30 DIAGNOSIS — I10 Essential (primary) hypertension: Secondary | ICD-10-CM | POA: Diagnosis not present

## 2023-09-30 DIAGNOSIS — N182 Chronic kidney disease, stage 2 (mild): Secondary | ICD-10-CM | POA: Diagnosis not present

## 2023-12-30 DIAGNOSIS — I2510 Atherosclerotic heart disease of native coronary artery without angina pectoris: Secondary | ICD-10-CM | POA: Diagnosis not present

## 2023-12-30 DIAGNOSIS — N182 Chronic kidney disease, stage 2 (mild): Secondary | ICD-10-CM | POA: Diagnosis not present

## 2023-12-30 DIAGNOSIS — E782 Mixed hyperlipidemia: Secondary | ICD-10-CM | POA: Diagnosis not present

## 2023-12-30 DIAGNOSIS — I10 Essential (primary) hypertension: Secondary | ICD-10-CM | POA: Diagnosis not present

## 2024-03-29 DIAGNOSIS — I2510 Atherosclerotic heart disease of native coronary artery without angina pectoris: Secondary | ICD-10-CM | POA: Diagnosis not present

## 2024-03-29 DIAGNOSIS — I10 Essential (primary) hypertension: Secondary | ICD-10-CM | POA: Diagnosis not present

## 2024-03-29 DIAGNOSIS — E782 Mixed hyperlipidemia: Secondary | ICD-10-CM | POA: Diagnosis not present

## 2024-03-29 DIAGNOSIS — N182 Chronic kidney disease, stage 2 (mild): Secondary | ICD-10-CM | POA: Diagnosis not present

## 2024-07-27 ENCOUNTER — Telehealth: Payer: Self-pay

## 2024-07-27 NOTE — Telephone Encounter (Signed)
 Patient is overdue for follow up. [facility] is listed as PCP with insurance. Patient needs an appt.

## 2024-08-10 ENCOUNTER — Telehealth: Payer: Self-pay

## 2024-08-10 NOTE — Telephone Encounter (Signed)
 [facility] is listed as PCP with Insurance. He will need to make appt to establish care or let us  know who his PCP is as well as let [employer] know.
